# Patient Record
Sex: Male | Born: 1955 | Race: White | Hispanic: No | Smoking: Former smoker
Health system: Southern US, Community
[De-identification: ages and names within clinical notes are randomized; demographics above are authoritative.]

## PROBLEM LIST (undated history)

## (undated) ENCOUNTER — Emergency Department: Discharge status: Absent without leave | Payer: Self-pay

## (undated) DIAGNOSIS — F32A Depression, unspecified: Secondary | ICD-10-CM

## (undated) DIAGNOSIS — E7800 Pure hypercholesterolemia, unspecified: Secondary | ICD-10-CM

## (undated) DIAGNOSIS — M1990 Unspecified osteoarthritis, unspecified site: Secondary | ICD-10-CM

## (undated) DIAGNOSIS — M549 Dorsalgia, unspecified: Secondary | ICD-10-CM

## (undated) DIAGNOSIS — I2510 Atherosclerotic heart disease of native coronary artery without angina pectoris: Secondary | ICD-10-CM

## (undated) DIAGNOSIS — I10 Essential (primary) hypertension: Secondary | ICD-10-CM

## (undated) DIAGNOSIS — F329 Major depressive disorder, single episode, unspecified: Secondary | ICD-10-CM

## (undated) DIAGNOSIS — F419 Anxiety disorder, unspecified: Secondary | ICD-10-CM

## (undated) HISTORY — DX: Depression, unspecified: F32.A

## (undated) HISTORY — DX: Pure hypercholesterolemia, unspecified: E78.00

## (undated) HISTORY — DX: Major depressive disorder, single episode, unspecified: F32.9

## (undated) HISTORY — DX: Essential (primary) hypertension: I10

## (undated) HISTORY — DX: Atherosclerotic heart disease of native coronary artery without angina pectoris: I25.10

## (undated) HISTORY — DX: Anxiety disorder, unspecified: F41.9

## (undated) HISTORY — PX: OTHER SURGICAL HISTORY: SHX169

## (undated) HISTORY — PX: HERNIA REPAIR: SHX51

## (undated) HISTORY — DX: Dorsalgia, unspecified: M54.9

## (undated) HISTORY — PX: VASECTOMY: SHX75

## (undated) HISTORY — DX: Unspecified osteoarthritis, unspecified site: M19.90

---

## 2003-04-04 ENCOUNTER — Ambulatory Visit (HOSPITAL_COMMUNITY): Admission: RE | Admit: 2003-04-04 | Discharge: 2003-04-04 | Payer: Self-pay | Admitting: General Surgery

## 2003-04-04 ENCOUNTER — Encounter: Payer: Self-pay | Admitting: General Surgery

## 2005-03-25 ENCOUNTER — Encounter: Admission: RE | Admit: 2005-03-25 | Discharge: 2005-03-25 | Payer: Self-pay | Admitting: *Deleted

## 2005-03-26 ENCOUNTER — Ambulatory Visit (HOSPITAL_COMMUNITY): Admission: RE | Admit: 2005-03-26 | Discharge: 2005-03-26 | Payer: Self-pay | Admitting: *Deleted

## 2005-12-03 ENCOUNTER — Encounter: Admission: RE | Admit: 2005-12-03 | Discharge: 2005-12-03 | Payer: Self-pay | Admitting: Family Medicine

## 2005-12-08 ENCOUNTER — Encounter: Admission: RE | Admit: 2005-12-08 | Discharge: 2005-12-08 | Payer: Self-pay | Admitting: Family Medicine

## 2007-10-12 ENCOUNTER — Encounter: Admission: RE | Admit: 2007-10-12 | Discharge: 2007-10-12 | Payer: Self-pay | Admitting: Family Medicine

## 2008-10-16 ENCOUNTER — Encounter: Admission: RE | Admit: 2008-10-16 | Discharge: 2008-10-16 | Payer: Self-pay | Admitting: Family Medicine

## 2010-11-01 ENCOUNTER — Encounter: Payer: Self-pay | Admitting: Sports Medicine

## 2010-11-02 ENCOUNTER — Encounter: Payer: Self-pay | Admitting: Family Medicine

## 2011-02-27 NOTE — Op Note (Signed)
NAME:  Jamie Rice, Jamie Rice                 ACCOUNT NO.:  [id number]   MEDICAL RECORD NO.:  [id number]                   PATIENT TYPE:  AMB   LOCATION:  DAY                                  FACILITY:  [facility]   PHYSICIAN:  Timothy E. Earlene Plater, M.D.              DATE OF BIRTH:  05-12-1956   DATE OF PROCEDURE:  04/04/2003  DATE OF DISCHARGE:                                 OPERATIVE REPORT   PREOPERATIVE DIAGNOSIS:  Ventral and umbilical hernia.   POSTOPERATIVE DIAGNOSIS:  Ventral and umbilicus hernia.   OPERATION/PROCEDURE:  Repair of ventral incisional hernia and umbilical  hernia, both with mesh.   SURGEON:  Timothy E. Earlene Plater, M.D.   ANESTHESIA:  General.   INDICATIONS:  Jamie Rice is an otherwise healthy 55 year old Caucasian  male who has developed over the past few months a painful, enlarging ventral  incisional hernia at the lower portion of a prior midline incision and an  umbilical hernia.  He was seen in the office yesterday in exquisite pain.  Both hernias were reducible.  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  Otherwise  his past history is unremarkable.  He did have an open hiatal hernia repair  approximately 25 years ago.  He has been fully informed and carefully  counseled in regards to the repair and this is being done as an urgent at  his request.  He is out of work at the present time because of the hernia  pain.  He was identified and the permit signed.  Laboratory data and EKG  reviewed by anesthesia.   DESCRIPTION OF PROCEDURE:  He was taken to the operating room and placed  supine.  General endotracheal anesthesia was administered.  The abdomen was  clear of hair.  It was prepped and draped in the usual fashion.  The ventral  incisional hernia was palpated and the lower part of the midline incision  which was  approximately 8 cm from the superior rim of the umbilicus.  The  umbilical hernia was in the umbilical circle just to the left of the  midline.  The skin and subcutaneous tissue were anesthetized with 0.5%  Marcaine with epinephrine.  An incision was made from the ventral hernia to  the superior rim of the umbilicus skin.  Subcutaneous dissection  accomplished, the hernia identified.  The surrounding fascia carefully  cleared away.  There was a second tiny hernia just above the known and more  obvious hernia.  This, too, was dissected free and the fascia around it was  identified and skeletonized.  The hernia contents were preperitoneal fat  only.  A couple of globs of that fat were removed with cautery but most of  it was just simply reduced.  Blunt dissection beneath the fascia freed the  fascial  edges.  The interrupted sutures of 0 Prolene were placed in the  defect and the defect closed with knots buried.  Then a piece of mesh was  cut and fashioned to fit well around the actual hernia defect and this was  sewn with continuous 2-0 PDS with the knots buried.   Attention was then turned to the umbilicus where careful sharp dissection  revealed otherwise normal fascia.  The hernia encompassed the umbilical  sites and was only about 1-1.5 cm in diameter.  The umbilical skin was  released from the preperitoneal fat and the preperitoneal fat was reduced  through the umbilical defect.  Likewise the underside was bluntly dissected  and that defect was closed with interrupted buried 0 Prolene sutures.  A  second piece of mesh was cut and fashioned to fit that defect and that, too,  was sewn down with running 2-0 PDS.  With this, the procedure was complete.  The wound was perfectly dry.  The subcutaneous was approximated in the deep  portion. The umbilical skin was indented inwardly.  The skin was closed with  3-0 Monocryl subcuticular.  Steri-Strips carefully applied.  Bandage  carefully  applied.  He tolerated it well.  Counts were correct.  He was  awakened and taken to the recovery room in good condition.   Written and verbal instructions given him and his father and he will be seen  and followed as an outpatient.  Percocet #48.                                               Timothy E. Earlene Plater, M.D.    TED/MEDQ  D:  04/04/2003  T:  04/04/2003  Job:  [id number]   cc:   L. Lupe Carney, M.D.  [address] [place]  [place] [address]  Fax: [phone]

## 2011-02-27 NOTE — Cardiovascular Report (Signed)
NAME:  Jamie Rice, GRUENEWALD NO.:  [id number]   MEDICAL RECORD NO.:  [id number]          PATIENT TYPE:  OIB   LOCATION:  2899                         FACILITY:  [facility]   PHYSICIAN:  Meade Maw, M.D.    DATE OF BIRTH:  1955-12-19   DATE OF PROCEDURE:  DATE OF DISCHARGE:                              CARDIAC CATHETERIZATION   REFERRING PHYSICIAN:  L. Lupe Carney, M.D.   INDICATIONS FOR PROCEDURE:  Reproducible chest pain on treadmill, ongoing  progressive chest pain.   PROCEDURE PERFORMED:  Left heart catheterization, coronary angiography,  single-plane ventriculogram.   DESCRIPTION OF PROCEDURE:  Obtaining written informed consent, the patient  was brought to the cardiac catheterization lab in the post absorptive state.  Preop sedation was achieved using Versed 10 mg IV, fentanyl 50 mcg IV and  Valium 10 mg p.o. The right groin was prepped and draped in the usual  sterile fashion, local anesthesia was achieved using 1% Xylocaine. A 6-  [place] hemostasis sheath was placed into the right femoral artery using  modified Seldinger technique. Selective coronary angiography was performed  using a JL-4, JR-4 Judkins catheter, multiple views were obtained and all  catheter exchanges were made over a guidewire. The hemostasis sheath was  flushed following each catheter exchange. There was no critical coronary  artery disease. The patient was transferred to the holding area. Hemostasis  was achieved using digital pressure.   FINDINGS:  Aortic pressure is 116/67, LV pressure is 111/12, EDP of 13.  Single-plane ventriculogram revealed normal wall motion with ejection  fraction of 65%.   CORONARY ANGIOGRAPHY:  The left main coronary artery bifurcates into the  left anterior descending and circumflex vessel. There is no disease noted in  the left main coronary artery.   LEFT ANTERIOR DESCENDING:  The left anterior descending gives rise to a  moderate D1, large D2, goes on to  end as an apical recurrent branch. There  is no disease noted in the left anterior descending or its branches.   CIRCUMFLEX VESSEL: The circumflex vessel is a large caliber vessel giving  rise to a trivial OM1, large bifurcating OM2  and this__________ vessel.  There is no disease noted in the circumflex or its branches.   RIGHT CORONARY ARTERY:  The right coronary artery is a large dominant  artery. There is luminal irregularities of up to 20-30% in the right  coronary artery.   FINAL IMPRESSION:  1.  Noncritical coronary artery disease.  2.  With luminal irregularities of up to 20-30%.  3.  Normal systolic function, ejection fraction of 65%.  4.  Fatigue may be in part related to bradycardia. The patient was noted to      have heart rates in the 40s during the procedure. His metoprolol was      discontinued and he was started on Norvasc 5 mg p.o. for his blood      pressure.       HP/MEDQ  D:  03/26/2005  T:  03/26/2005  Job:  [id number]

## 2011-08-20 ENCOUNTER — Other Ambulatory Visit: Payer: Self-pay | Admitting: Family Medicine

## 2011-08-20 ENCOUNTER — Ambulatory Visit
Admission: RE | Admit: 2011-08-20 | Discharge: 2011-08-20 | Disposition: A | Discharge status: Home / Self Care | Payer: BC Managed Care – PPO | Source: Ambulatory Visit | Attending: Family Medicine | Admitting: Family Medicine

## 2011-08-20 DIAGNOSIS — R52 Pain, unspecified: Secondary | ICD-10-CM

## 2013-08-10 ENCOUNTER — Telehealth (HOSPITAL_COMMUNITY): Payer: Self-pay | Admitting: *Deleted

## 2013-08-15 ENCOUNTER — Encounter: Payer: Self-pay | Admitting: *Deleted

## 2013-08-15 ENCOUNTER — Encounter: Payer: Self-pay | Admitting: Interventional Cardiology

## 2013-08-15 DIAGNOSIS — M1990 Unspecified osteoarthritis, unspecified site: Secondary | ICD-10-CM | POA: Insufficient documentation

## 2013-08-15 DIAGNOSIS — I2510 Atherosclerotic heart disease of native coronary artery without angina pectoris: Secondary | ICD-10-CM | POA: Insufficient documentation

## 2013-08-15 DIAGNOSIS — F419 Anxiety disorder, unspecified: Secondary | ICD-10-CM | POA: Insufficient documentation

## 2013-08-15 DIAGNOSIS — I10 Essential (primary) hypertension: Secondary | ICD-10-CM | POA: Insufficient documentation

## 2013-08-15 DIAGNOSIS — F329 Major depressive disorder, single episode, unspecified: Secondary | ICD-10-CM | POA: Insufficient documentation

## 2013-08-15 DIAGNOSIS — E7800 Pure hypercholesterolemia, unspecified: Secondary | ICD-10-CM | POA: Insufficient documentation

## 2013-08-15 DIAGNOSIS — M549 Dorsalgia, unspecified: Secondary | ICD-10-CM | POA: Insufficient documentation

## 2013-08-16 ENCOUNTER — Ambulatory Visit (INDEPENDENT_AMBULATORY_CARE_PROVIDER_SITE_OTHER): Payer: Medicaid Other | Admitting: Interventional Cardiology

## 2013-08-16 ENCOUNTER — Encounter: Payer: Self-pay | Admitting: Interventional Cardiology

## 2013-08-16 ENCOUNTER — Telehealth: Payer: Self-pay | Admitting: Interventional Cardiology

## 2013-08-16 VITALS — BP 142/86 | HR 55 | Ht 71.5 in | Wt 201.8 lb

## 2013-08-16 DIAGNOSIS — I2510 Atherosclerotic heart disease of native coronary artery without angina pectoris: Secondary | ICD-10-CM | POA: Insufficient documentation

## 2013-08-16 DIAGNOSIS — I10 Essential (primary) hypertension: Secondary | ICD-10-CM | POA: Insufficient documentation

## 2013-08-16 DIAGNOSIS — I2119 ST elevation (STEMI) myocardial infarction involving other coronary artery of inferior wall: Secondary | ICD-10-CM

## 2013-08-16 DIAGNOSIS — E1169 Type 2 diabetes mellitus with other specified complication: Secondary | ICD-10-CM | POA: Insufficient documentation

## 2013-08-16 DIAGNOSIS — I252 Old myocardial infarction: Secondary | ICD-10-CM | POA: Insufficient documentation

## 2013-08-16 DIAGNOSIS — E782 Mixed hyperlipidemia: Secondary | ICD-10-CM | POA: Insufficient documentation

## 2013-08-16 DIAGNOSIS — R5381 Other malaise: Secondary | ICD-10-CM

## 2013-08-16 NOTE — Progress Notes (Signed)
Patient ID: Jamie Rice, male   DOB: December 12, 1955, 57 y.o.   MRN: [id number]     Patient ID: Jamie Rice MRN: [id number] DOB/AGE: Oct 28, 1955 57 y.o.   Referring Physician  Dr. Lupe Carney   Reason for Consultation recent Inferior MI  HPI: 57 y/o who had an inferior MI in October 2014.  He had a bare metal stent placed and did well.  He had no chest pain.  He felt hot and sweaty.  No SHOB.  He was taken to [facility] and had an emergency cath.  Mild CP with SHOB since the heart atack.  He has been tired.  He sleeps a lot.  He feels tired even after a nap.  No LE swelling.  No orthopnea.    Records from [facility] reviewed. He had a 4.0 x 20 bare-metal stent to the right coronary artery. He had inferior and apical hypokinesis. Ejection fraction was preserved.      Current Outpatient Prescriptions  Medication Sig Dispense Refill  . aspirin 81 MG tablet Take 81 mg by mouth daily.      Marland Kitchen atorvastatin (LIPITOR) 80 MG tablet Take 80 mg by mouth daily.      . carvedilol (COREG) 6.25 MG tablet Take 6.25 mg by mouth 2 (two) times daily with a meal.      . Cholecalciferol (VITAMIN D) 400 UNIT/ML LIQD Take by mouth. 400 unit Tablet      . clopidogrel (PLAVIX) 75 MG tablet Take 75 mg by mouth daily with breakfast.      . enalapril (VASOTEC) 10 MG tablet Take 10 mg by mouth daily.      . ferrous sulfate 325 (65 FE) MG tablet Take 325 mg by mouth daily with breakfast.      . nitroGLYCERIN (NITROSTAT) 0.4 MG SL tablet Place 0.4 mg under the tongue every 5 (five) minutes as needed for chest pain.      . Omega-3 Fatty Acids (FISH OIL PO) Take 1 tablet by mouth daily.      . Ticagrelor (BRILINTA) 90 MG TABS tablet Take 90 mg by mouth 2 (two) times daily.      Marland Kitchen triamcinolone (NASACORT AQ) 55 MCG/ACT AERO nasal inhaler Place 2 sprays into the nose daily.       No current facility-administered medications for this visit.   Past Medical History  Diagnosis Date  . Hypercholesteremia   .  HTN (hypertension)   . Depression   . Anxiety   . Arthritis   . Back pain   . CAD (coronary artery disease)     Family History  Problem Relation Age of Onset  . Heart disease Father     History   Social History  . Marital Status: Widowed    Spouse Name: N/A    Number of Children: N/A  . Years of Education: N/A   Occupational History  . Not on file.   Social History Main Topics  . Smoking status: Former Games developer  . Smokeless tobacco: Not on file  . Alcohol Use: Yes  . Drug Use: No  . Sexual Activity: Not on file   Other Topics Concern  . Not on file   Social History Narrative  . No narrative on file    Past Surgical History  Procedure Laterality Date  . Hernia repair    . Vasectomy    . Ptca with stent - [facility]        (Not in a hospital admission)  Review of  systems complete and found to be negative unless listed above .  No nausea, vomiting.  No fever chills, No focal weakness,  No palpitations.  Physical Exam: Filed Vitals:   08/16/13 1103  BP: 142/86  Pulse: 55    Weight: 201 lb 12.8 oz (91.536 kg)  Physical exam:  [place]/AT EOMI No JVD, No carotid bruit RRR S1S2  No wheezing Soft. NT, nondistended No edema. 2+ posterior tibial pulses bilaterally No focal motor or sensory deficits Normal affect  Labs:   No results found for this basename: WBC, HGB, HCT, MCV, PLT   No results found for this basename: NA, K, CL, CO2, BUN, CREATININE, CALCIUM, LABALBU, PROT, BILITOT, ALKPHOS, ALT, AST, GLUCOSE,  in the last 168 hours No results found for this basename: CKTOTAL, CKMB, CKMBINDEX, TROPONINI    No results found for this basename: CHOL   No results found for this basename: HDL   No results found for this basename: LDLCALC   No results found for this basename: TRIG   No results found for this basename: CHOLHDL   No results found for this basename: LDLDIRECT       EKG: Initial ECG from MI reveals sinus rhythm with marked inferior ST  elevation  ASSESSMENT AND PLAN: Acute inferior MI, status post bare-metal stent, hyperlipidemia, hypertension  1. continue dual antiplatelet therapy with Brilinta for a month. Bare-metal stent so he does not require mandatory 1 year dual antiplatelet therapy. He will switch to Plavix for cost savings after a month.  No bleeding issues. 2.  fatigue is increased. I encouraged him to start cardiac rehabilitation. Will check echocardiogram to evaluate LV function and see if there is any valvular abnormality post MI which explains his decreased energy.  No obvious signs of heart failure. 3. CAD : Mild to moderate disease in the left system. Should not be causing angina. 4. hyperlipidemia: Continue atorvastatin.  He'll need lipids checked as well. LDL target 70. 5. hypertension: Continue enalapril. He is also on carvedilol. Some of the medications may be contributing to fatigue. Will see how he is feeling after he begins cardiac rehabilitation. Hopefully this will improve.  Signed:   Fredric Mare, MD, [facility] 08/16/2013, 11:11 AM

## 2013-08-16 NOTE — Telephone Encounter (Signed)
ROI faxed to [facility]/ MR Dept at [phone] call back [phone]

## 2013-08-16 NOTE — Patient Instructions (Signed)
Your physician has requested that you have an echocardiogram. Echocardiography is a painless test that uses sound waves to create images of your heart. It provides your doctor with information about the size and shape of your heart and how well your heart's chambers and valves are working. This procedure takes approximately one hour. There are no restrictions for this procedure.  Your physician recommends that you schedule a follow-up appointment in 3 months with Dr. Eldridge Dace.

## 2013-08-24 ENCOUNTER — Encounter (HOSPITAL_COMMUNITY)
Admission: RE | Admit: 2013-08-24 | Discharge: 2013-08-24 | Disposition: A | Discharge status: Home / Self Care | Payer: Medicaid Other | Source: Ambulatory Visit | Attending: Interventional Cardiology | Admitting: Interventional Cardiology

## 2013-08-24 NOTE — Progress Notes (Signed)
Cardiac Rehab Medication Review by a Pharmacist  Does the patient  feel that his/her medications are working for him/her?  yes  Has the patient been experiencing any side effects to the medications prescribed?  no  Does the patient measure his/her own blood pressure or blood glucose at home?  yes   Does the patient have any problems obtaining medications due to transportation or finances?   no  Understanding of regimen: good Understanding of indications: good Potential of compliance: excellent    Pharmacist comments:  19 YOM who presents in good spirits by appears lethargic. Patient denies any known allergies besides for his seasonal allergies, currently mitigating with nasacort. I have updated his medication list. Patient reports BP around 128/73 and denies any adverse effects from his medications but does seem to feel much more tired since his heart attack in October. He reports fatigue after exertion much quicker than in the past. I assured patient that it would take a little time for him to recover and that he should be hopeful as he will likely see his energy increase over the next few weeks.   Thanks for allowing me to take part in the care of this patient,  Britt Bottom B. Artelia Laroche, PharmD Clinical Pharmacist - Resident Pager: [phone] Phone: [phone] 08/24/2013 8:23 AM

## 2013-08-28 ENCOUNTER — Telehealth (HOSPITAL_COMMUNITY): Payer: Self-pay | Admitting: Cardiac Rehabilitation

## 2013-08-28 ENCOUNTER — Encounter (HOSPITAL_COMMUNITY): Admission: RE | Admit: 2013-08-28 | Payer: Medicaid Other | Source: Ambulatory Visit

## 2013-08-28 NOTE — Telephone Encounter (Signed)
pc received from pt will be absent from cardiac rehab today due to bad head cold.

## 2013-08-29 ENCOUNTER — Telehealth: Payer: Self-pay | Admitting: Cardiology

## 2013-08-29 ENCOUNTER — Ambulatory Visit (HOSPITAL_COMMUNITY): Payer: Medicaid Other | Attending: Cardiology | Admitting: Cardiology

## 2013-08-29 ENCOUNTER — Encounter: Payer: Self-pay | Admitting: Cardiology

## 2013-08-29 DIAGNOSIS — I2119 ST elevation (STEMI) myocardial infarction involving other coronary artery of inferior wall: Secondary | ICD-10-CM | POA: Insufficient documentation

## 2013-08-29 DIAGNOSIS — I10 Essential (primary) hypertension: Secondary | ICD-10-CM | POA: Insufficient documentation

## 2013-08-29 DIAGNOSIS — Z87891 Personal history of nicotine dependence: Secondary | ICD-10-CM | POA: Insufficient documentation

## 2013-08-29 DIAGNOSIS — E785 Hyperlipidemia, unspecified: Secondary | ICD-10-CM | POA: Insufficient documentation

## 2013-08-29 DIAGNOSIS — I2510 Atherosclerotic heart disease of native coronary artery without angina pectoris: Secondary | ICD-10-CM | POA: Insufficient documentation

## 2013-08-29 NOTE — Progress Notes (Signed)
Echo performed. 

## 2013-08-29 NOTE — Telephone Encounter (Signed)
Pt requested to speak with me before he had echocardiogram this morning. Pt states he has had episodes where he has woken up having to cough and feeling sweaty and hot. Pt states this has happened at least three times over the last week. Pt also states he has Cardiac Rehab on Mon, Wed, and Fri from 8:15am to 9:45am. Pt is requesting disability until he gets CR completed. If not, he needs a note stating that he has rehab three times a week.

## 2013-08-30 ENCOUNTER — Encounter (HOSPITAL_COMMUNITY): Payer: Medicaid Other

## 2013-08-30 NOTE — Telephone Encounter (Signed)
I don't think he would qualify for disability. If he has symptoms similar to his prior MI, we would have to consider repeat cardiac catheterization. OK to give him a note stating that he has cardiac rehabilitation 3 times a week as he has requested.

## 2013-08-31 ENCOUNTER — Encounter: Payer: Self-pay | Admitting: Cardiology

## 2013-08-31 NOTE — Telephone Encounter (Signed)
Pt notified. Pt just had those three episodes where he became hot and clamy,which he did have prior to his MI. Pt denies CP. The last episode was Tuesday morning and he almost called 911. Pt has been feeling okay since then. I will mail a letter to pt stating he has cardiac rehab three times a week.

## 2013-09-01 ENCOUNTER — Encounter (HOSPITAL_COMMUNITY): Payer: Medicaid Other

## 2013-09-04 ENCOUNTER — Encounter (HOSPITAL_COMMUNITY): Payer: Medicaid Other

## 2013-09-06 ENCOUNTER — Encounter (HOSPITAL_COMMUNITY): Payer: Medicaid Other

## 2013-09-08 ENCOUNTER — Encounter (HOSPITAL_COMMUNITY): Payer: Medicaid Other

## 2013-09-11 ENCOUNTER — Encounter (HOSPITAL_COMMUNITY): Payer: Medicaid Other

## 2013-09-12 ENCOUNTER — Telehealth (HOSPITAL_COMMUNITY): Payer: Self-pay | Admitting: Cardiac Rehabilitation

## 2013-09-12 NOTE — Telephone Encounter (Signed)
pc to pt to assess reason for continued absence from cardiac rehab. Left message on voice mail.  

## 2013-09-13 ENCOUNTER — Encounter (HOSPITAL_COMMUNITY): Payer: Medicaid Other

## 2013-09-15 ENCOUNTER — Encounter (HOSPITAL_COMMUNITY): Payer: Medicaid Other

## 2013-09-18 ENCOUNTER — Encounter (HOSPITAL_COMMUNITY): Payer: Medicaid Other

## 2013-09-19 ENCOUNTER — Telehealth (HOSPITAL_COMMUNITY): Payer: Self-pay | Admitting: Cardiac Rehabilitation

## 2013-09-19 NOTE — Telephone Encounter (Signed)
pc to pt to assess reason for continued absence from cardiac rehab.  Left message on vm.

## 2013-09-20 ENCOUNTER — Encounter (HOSPITAL_COMMUNITY): Payer: Medicaid Other

## 2013-09-22 ENCOUNTER — Encounter (HOSPITAL_COMMUNITY): Payer: Medicaid Other

## 2013-09-25 ENCOUNTER — Encounter (HOSPITAL_COMMUNITY): Payer: Medicaid Other

## 2013-09-27 ENCOUNTER — Encounter (HOSPITAL_COMMUNITY): Payer: Medicaid Other

## 2013-09-29 ENCOUNTER — Encounter (HOSPITAL_COMMUNITY): Payer: Medicaid Other

## 2013-10-02 ENCOUNTER — Encounter (HOSPITAL_COMMUNITY): Payer: Medicaid Other

## 2013-10-02 ENCOUNTER — Telehealth: Payer: Self-pay | Admitting: Interventional Cardiology

## 2013-10-02 NOTE — Telephone Encounter (Signed)
New problem     Pt would like a call back on what he can take for his sinus drainage  with the med he is taking? Pt says draining into his pocket.   Please give pt a call back.

## 2013-10-02 NOTE — Telephone Encounter (Signed)
To Jeremy, please advise.  

## 2013-10-02 NOTE — Telephone Encounter (Signed)
Patient states his drainage at night is really bad and wants to know if there is a better antihistamine than Claritin.  He doesn't want something that will make him sedated during the day though.  Patient advised to take Zyrtec 10 mg qhs as this is typically more effective than claritin.  If this doesn't work, could consider chlorphenarimine based antihistamine, but this is often too sedating.

## 2013-10-04 ENCOUNTER — Encounter (HOSPITAL_COMMUNITY): Payer: Medicaid Other

## 2013-10-06 ENCOUNTER — Encounter (HOSPITAL_COMMUNITY): Payer: Medicaid Other

## 2013-10-09 ENCOUNTER — Encounter (HOSPITAL_COMMUNITY): Payer: Medicaid Other

## 2013-10-11 ENCOUNTER — Encounter (HOSPITAL_COMMUNITY): Payer: Medicaid Other

## 2013-10-13 ENCOUNTER — Encounter (HOSPITAL_COMMUNITY): Payer: Medicaid Other

## 2013-10-16 ENCOUNTER — Encounter (HOSPITAL_COMMUNITY): Payer: Medicaid Other

## 2013-10-18 ENCOUNTER — Encounter (HOSPITAL_COMMUNITY): Payer: Medicaid Other

## 2013-10-20 ENCOUNTER — Encounter (HOSPITAL_COMMUNITY): Payer: Medicaid Other

## 2013-10-23 ENCOUNTER — Encounter (HOSPITAL_COMMUNITY): Payer: Medicaid Other

## 2013-10-25 ENCOUNTER — Encounter (HOSPITAL_COMMUNITY): Payer: Medicaid Other

## 2013-10-27 ENCOUNTER — Encounter (HOSPITAL_COMMUNITY): Payer: Medicaid Other

## 2013-10-30 ENCOUNTER — Encounter (HOSPITAL_COMMUNITY): Payer: Medicaid Other

## 2013-11-01 ENCOUNTER — Encounter (HOSPITAL_COMMUNITY): Payer: Medicaid Other

## 2013-11-03 ENCOUNTER — Encounter (HOSPITAL_COMMUNITY): Payer: Medicaid Other

## 2013-11-06 ENCOUNTER — Encounter (HOSPITAL_COMMUNITY): Payer: Medicaid Other

## 2013-11-08 ENCOUNTER — Encounter (HOSPITAL_COMMUNITY): Payer: Medicaid Other

## 2013-11-10 ENCOUNTER — Encounter (HOSPITAL_COMMUNITY): Payer: Medicaid Other

## 2013-11-13 ENCOUNTER — Encounter (HOSPITAL_COMMUNITY): Payer: Medicaid Other

## 2013-11-15 ENCOUNTER — Encounter (HOSPITAL_COMMUNITY): Payer: Medicaid Other

## 2013-11-15 ENCOUNTER — Ambulatory Visit: Payer: Medicaid Other | Admitting: Interventional Cardiology

## 2013-11-17 ENCOUNTER — Encounter (HOSPITAL_COMMUNITY): Payer: Medicaid Other

## 2013-11-20 ENCOUNTER — Encounter (HOSPITAL_COMMUNITY): Payer: Medicaid Other

## 2013-11-22 ENCOUNTER — Encounter (HOSPITAL_COMMUNITY): Payer: Medicaid Other

## 2013-11-24 ENCOUNTER — Encounter (HOSPITAL_COMMUNITY): Payer: Medicaid Other

## 2013-11-27 ENCOUNTER — Encounter (HOSPITAL_COMMUNITY): Payer: Medicaid Other

## 2013-11-29 ENCOUNTER — Encounter (HOSPITAL_COMMUNITY): Payer: Medicaid Other

## 2013-12-01 ENCOUNTER — Encounter (HOSPITAL_COMMUNITY): Payer: Medicaid Other

## 2013-12-20 ENCOUNTER — Ambulatory Visit: Payer: Medicaid Other | Admitting: Physician Assistant

## 2013-12-30 ENCOUNTER — Encounter: Payer: Self-pay | Admitting: *Deleted

## 2014-01-04 ENCOUNTER — Ambulatory Visit: Payer: Medicaid Other | Admitting: Physician Assistant

## 2014-07-04 ENCOUNTER — Ambulatory Visit (INDEPENDENT_AMBULATORY_CARE_PROVIDER_SITE_OTHER): Payer: Medicaid Other | Admitting: Interventional Cardiology

## 2014-07-04 ENCOUNTER — Encounter: Payer: Self-pay | Admitting: Interventional Cardiology

## 2014-07-04 VITALS — BP 138/82 | HR 55 | Ht 71.25 in | Wt 207.4 lb

## 2014-07-04 DIAGNOSIS — E782 Mixed hyperlipidemia: Secondary | ICD-10-CM

## 2014-07-04 DIAGNOSIS — Z01810 Encounter for preprocedural cardiovascular examination: Secondary | ICD-10-CM

## 2014-07-04 DIAGNOSIS — I2119 ST elevation (STEMI) myocardial infarction involving other coronary artery of inferior wall: Secondary | ICD-10-CM

## 2014-07-04 DIAGNOSIS — I252 Old myocardial infarction: Secondary | ICD-10-CM

## 2014-07-04 DIAGNOSIS — I2510 Atherosclerotic heart disease of native coronary artery without angina pectoris: Secondary | ICD-10-CM

## 2014-07-04 DIAGNOSIS — I10 Essential (primary) hypertension: Secondary | ICD-10-CM

## 2014-07-04 LAB — COMPREHENSIVE METABOLIC PANEL
ALBUMIN: 4.2 g/dL (ref 3.5–5.2)
ALK PHOS: 74 U/L (ref 39–117)
ALT: 19 U/L (ref 0–53)
AST: 16 U/L (ref 0–37)
BUN: 13 mg/dL (ref 6–23)
CO2: 32 mEq/L (ref 19–32)
Calcium: 9 mg/dL (ref 8.4–10.5)
Chloride: 102 mEq/L (ref 96–112)
Creatinine, Ser: 1.1 mg/dL (ref 0.4–1.5)
GFR: 74.55 mL/min (ref >60.00)
Glucose, Bld: 127 mg/dL — ABNORMAL HIGH (ref 70–99)
POTASSIUM: 4.2 meq/L (ref 3.5–5.1)
SODIUM: 137 meq/L (ref 135–145)
TOTAL PROTEIN: 7.1 g/dL (ref 6.0–8.3)
Total Bilirubin: 0.3 mg/dL (ref 0.2–1.2)

## 2014-07-04 LAB — LIPID PANEL
CHOL/HDL RATIO: 6
Cholesterol: 220 mg/dL — ABNORMAL HIGH (ref 0–200)
HDL: 34.9 mg/dL — ABNORMAL LOW (ref >39.00)
NONHDL: 185.1
Triglycerides: 399 mg/dL — ABNORMAL HIGH (ref 0.0–149.0)
VLDL: 79.8 mg/dL — ABNORMAL HIGH (ref 0.0–40.0)

## 2014-07-04 LAB — LDL CHOLESTEROL, DIRECT: Direct LDL: 123.6 mg/dL

## 2014-07-04 MED ORDER — ATORVASTATIN CALCIUM 80 MG PO TABS: 80.0000 mg | ORAL_TABLET | Freq: Every day | ORAL | Status: DC

## 2014-07-04 MED ORDER — ENALAPRIL MALEATE 10 MG PO TABS: 10.0000 mg | ORAL_TABLET | Freq: Every day | ORAL | Status: DC

## 2014-07-04 MED ORDER — CLOPIDOGREL BISULFATE 75 MG PO TABS: 75.0000 mg | ORAL_TABLET | Freq: Every day | ORAL | Status: DC

## 2014-07-04 NOTE — Progress Notes (Signed)
Patient ID: Jamie Rice, male   DOB: Jun 13, 1956, 58 y.o.   MRN: [id number] Patient ID: Jamie Rice, male   DOB: May 10, 1956, 58 y.o.   MRN: [id number]     Patient ID: Jamie Rice MRN: [id number] DOB/AGE: [birth year] 58 y.o.   Referring Physician  Dr. Donnie Coffin   Reason for Consultation recent Inferior MI  HPI: 58 y/o who had an inferior MI in October 2014.  He had a bare metal stent placed and did well.  He had no chest pain.  He felt hot and sweaty.  No SHOB.  He was taken to [facility] and had an emergency cath.  Mild CP with SHOB since the heart atack.  He has been tired.  He sleeps a lot, but oes not sleep well at night.  He feels tired even after a nap.  No LE swelling.  No orthopnea.    Records from [place] reviewed. He had a 4.0 x 20 bare-metal stent to the right coronary artery in October 2014. He had inferior and apical hypokinesis. Ejection fraction was preserved.   He is anticipating hip replacement in 11/15.  He walks up and down the street and this is limited by hip pain.  No chest pain or any sx like the heart attack.  He can go 15-20 minutes and has no cardiac sx.   He was out of insurance so was out of all of his prescription meds since Spring 2015.     Current Outpatient Prescriptions  Medication Sig Dispense Refill  . aspirin 81 MG tablet Take 81 mg by mouth daily.      . Cinnamon 500 MG capsule Take 500 mg by mouth 2 (two) times daily.      . Omega-3 Fatty Acids (FISH OIL PO) Take 1 tablet by mouth daily.       No current facility-administered medications for this visit.   Past Medical History  Diagnosis Date  . Hypercholesteremia   . HTN (hypertension)   . Depression   . Anxiety   . Arthritis   . Back pain   . CAD (coronary artery disease)     Family History  Problem Relation Age of Onset  . Heart disease Father     History   Social History  . Marital Status: Widowed    Spouse Name: N/A    Number of Children: N/A  . Years  of Education: N/A   Occupational History  . Not on file.   Social History Main Topics  . Smoking status: Former Research scientist (life sciences)  . Smokeless tobacco: Not on file  . Alcohol Use: Yes  . Drug Use: No  . Sexual Activity: Not on file   Other Topics Concern  . Not on file   Social History Narrative  . No narrative on file    Past Surgical History  Procedure Laterality Date  . Hernia repair    . Vasectomy    . Ptca with stent - [facility]        (Not in a hospital admission)  Review of systems complete and found to be negative unless listed above .  No nausea, vomiting.  No fever chills, No focal weakness,  No palpitations.  Physical Exam: Filed Vitals:   07/04/14 0755  BP: 138/82  Pulse: 55    Weight: 207 lb 6.4 oz (94.076 kg)  Physical exam:  [place]/AT EOMI No JVD, No carotid bruit RRR S1S2  No wheezing Soft. NT, nondistended No edema. 2+ posterior tibial pulses  bilaterally No focal motor or sensory deficits Normal affect  Labs:   No results found for this basename: WBC,  HGB,  HCT,  MCV,  PLT   No results found for this basename: NA, K, CL, CO2, BUN, CREATININE, CALCIUM, LABALBU, PROT, BILITOT, ALKPHOS, ALT, AST, GLUCOSE,  in the last 168 hours No results found for this basename: CKTOTAL,  CKMB,  CKMBINDEX,  TROPONINI    No results found for this basename: CHOL   No results found for this basename: HDL   No results found for this basename: LDLCALC   No results found for this basename: TRIG   No results found for this basename: CHOLHDL   No results found for this basename: LDLDIRECT       EKG: Initial ECG from MI reveals sinus rhythm with marked inferior ST elevation; today Sinus bradycardia, no ST segment changes  ASSESSMENT AND PLAN: Acute inferior MI, status post bare-metal stent, hyperlipidemia, hypertension  1. Old MI: Finished dual antiplatelet therapy with Brilinta for a month. Bare-metal stent to RCA so he did not require 1 year dual antiplatelet therapy.  Will restart Plavix .  No bleeding issues. Wil have to hold PLavix or surgery. 2.  fatigue is increased. Finished start cardiac rehabilitation. Echocardiogram showed normal LV function, no significant valvular abnormality post MI which explains his decreased energy.  No obvious signs of heart failure.  Fatigue may be from deconditioning due to lack of activity from ortho issues. 3. CAD : Mild to moderate disease in the left system. Should not be causing angina. 4. hyperlipidemia: Restart atorvastatin.  He'll need lipids checked as well. LDL target 70.  Will adjust dose of atorvastatin based on lipids. 5. hypertension: Restart enalapril 10 mg daily. Hold carvedilol since resting HR 55. He finished cardiac rehabilitation. Hopefully this will improve with exercise as well. 6. No further cardiac testing needed before hip surgery.  He will have some labs preop most likely before hip surgery as well.   Signed:   Mina Marble, MD, [facility] 07/04/2014, 8:03 AM

## 2014-07-04 NOTE — Patient Instructions (Signed)
Your physician has recommended you make the following change in your medication:   1. Restart Plavix 75 mg 1 tablet daily.   2. Restart Enalapril 10 mg 1 tablet daily.   3. Restart Atorvastatin 80 mg 1 tablet daily.   Your physician recommends that you return for a FASTING lipid and cmet today.   Your physician wants you to follow-up in: 6 months with Dr. Irish Lack. You will receive a reminder letter in the mail two months in advance. If you don't receive a letter, please call our office to schedule the follow-up appointment.

## 2014-07-11 ENCOUNTER — Telehealth: Payer: Self-pay | Admitting: Interventional Cardiology

## 2014-07-11 ENCOUNTER — Other Ambulatory Visit: Payer: Self-pay | Admitting: Cardiology

## 2014-07-11 DIAGNOSIS — E782 Mixed hyperlipidemia: Secondary | ICD-10-CM

## 2014-07-11 NOTE — Telephone Encounter (Signed)
Returned pts call. See lipid note.

## 2014-07-11 NOTE — Telephone Encounter (Signed)
Follow up:    Per pt calling back for labs results.

## 2014-10-03 ENCOUNTER — Other Ambulatory Visit (INDEPENDENT_AMBULATORY_CARE_PROVIDER_SITE_OTHER): Payer: Medicaid Other | Admitting: *Deleted

## 2014-10-03 DIAGNOSIS — E782 Mixed hyperlipidemia: Secondary | ICD-10-CM

## 2014-10-03 LAB — LIPID PANEL
CHOL/HDL RATIO: 4
CHOLESTEROL: 146 mg/dL (ref 0–200)
HDL: 32.9 mg/dL — AB (ref >39.00)
LDL Cholesterol: 80 mg/dL (ref 0–99)
NonHDL: 113.1
Triglycerides: 167 mg/dL — ABNORMAL HIGH (ref 0.0–149.0)
VLDL: 33.4 mg/dL (ref 0.0–40.0)

## 2014-10-03 LAB — HEPATIC FUNCTION PANEL
ALBUMIN: 4.4 g/dL (ref 3.5–5.2)
ALT: 20 U/L (ref 0–53)
AST: 15 U/L (ref 0–37)
Alkaline Phosphatase: 127 U/L — ABNORMAL HIGH (ref 39–117)
BILIRUBIN TOTAL: 0.6 mg/dL (ref 0.2–1.2)
Bilirubin, Direct: 0 mg/dL (ref 0.0–0.3)
Total Protein: 7 g/dL (ref 6.0–8.3)

## 2014-11-27 ENCOUNTER — Telehealth: Payer: Self-pay | Admitting: Interventional Cardiology

## 2014-11-27 NOTE — Telephone Encounter (Signed)
New message     Pt is on plavix.  His PCP want to prescribe an antiflammatory for his arthritis.  Pt has been on plavix for over 1 yr.  Will it be ok to stop plavix and start the antiinflammatory?  If yes , call pt and also fax a note to Dr Donnie Coffin at [place]

## 2014-11-27 NOTE — Telephone Encounter (Signed)
Longterm antiinflammatory medicine is not good for his heart.  If it is for a short time, up to a week, that is ok.  What is the expected duration of therapy?

## 2014-11-27 NOTE — Telephone Encounter (Signed)
Left pt a message to call back. 

## 2014-11-27 NOTE — Telephone Encounter (Signed)
Pt return a call and states that his PCP Dr. Donnie Coffin at [facility]  Would like to prescribed an antiinflammatory medication for his arthritis. Pt needs to have D/C  Plavix before this medicationcan be  prescribed. Pt had bare metal stents placement in october 2014 . If pt can stop taking the Plavix. Pt  would like for this office to call him to let him know, and send a note to his PCP Dr.Dean Alroy Dust at [place] at [place].

## 2014-11-28 NOTE — Telephone Encounter (Signed)
I spoke with LouAnn from Dr. Virgilio Belling office. She was unsure of which NSAID was to be used. Dr. Alroy Dust was wanting to know if the patient was going to stay on plavix long term and that would help drive his decision. I advised her of Dr. Hassell Done recommendations thus far. I advised I would clarify how long the patient will need to be on plavix and is PRN usage of an NSAID ok. I will fax this in writing to [address] at [phone] when available.

## 2014-11-28 NOTE — Telephone Encounter (Signed)
I spoke with the patient and advised him of Dr. Hassell Done recommendations. He states that he was intending to take the NSAID PRN. I advised him I would call to Dr. Virgilio Belling office to find out what the recommended drug and directions for use are. I will then review with Dr. Irish Lack and call him back. He voices understanding. Alvis Lemmings, RN, BSN  I left a message with Dr. Virgilio Belling office to call back with recommended NSAID and directions for use [id number]). Alvis Lemmings, RN, BSN

## 2014-11-28 NOTE — Telephone Encounter (Signed)
I would plan to leave Plavix on longterm given his cardiovascular risk.  Prn anti inflammatory is ok as long as he is just taking it for a few days at a time- not daily.  WOuld consider some acid reducing therapy.  Could stop aspirin as long as he is on Plavix longterm to lower GI risk.

## 2014-11-29 ENCOUNTER — Encounter: Payer: Self-pay | Admitting: *Deleted

## 2014-11-29 NOTE — Telephone Encounter (Signed)
The patient is aware of Dr. Hassell Done recommendations to Dr. Alroy Dust. Letter being faxed to [facility] today at [phone]. Confirmation received.

## 2015-07-01 ENCOUNTER — Other Ambulatory Visit: Payer: Self-pay | Admitting: Interventional Cardiology

## 2015-08-02 ENCOUNTER — Other Ambulatory Visit: Payer: Self-pay | Admitting: Interventional Cardiology

## 2015-08-05 ENCOUNTER — Other Ambulatory Visit: Payer: Self-pay | Admitting: Interventional Cardiology

## 2015-08-21 ENCOUNTER — Other Ambulatory Visit: Payer: Self-pay | Admitting: Interventional Cardiology

## 2015-09-13 ENCOUNTER — Other Ambulatory Visit: Payer: Self-pay | Admitting: Interventional Cardiology

## 2015-09-24 ENCOUNTER — Other Ambulatory Visit: Payer: Self-pay | Admitting: Interventional Cardiology

## 2015-09-25 ENCOUNTER — Other Ambulatory Visit: Payer: Self-pay | Admitting: Interventional Cardiology

## 2015-10-01 ENCOUNTER — Encounter: Payer: Self-pay | Admitting: Interventional Cardiology

## 2015-10-09 ENCOUNTER — Other Ambulatory Visit: Payer: Self-pay | Admitting: Interventional Cardiology

## 2015-10-10 ENCOUNTER — Other Ambulatory Visit: Payer: Self-pay | Admitting: Interventional Cardiology

## 2015-10-28 ENCOUNTER — Other Ambulatory Visit: Payer: Self-pay | Admitting: *Deleted

## 2015-10-28 MED ORDER — ENALAPRIL MALEATE 10 MG PO TABS: 10.0000 mg | ORAL_TABLET | Freq: Every day | ORAL | Status: DC

## 2015-10-30 ENCOUNTER — Other Ambulatory Visit: Payer: Self-pay | Admitting: Interventional Cardiology

## 2015-11-07 ENCOUNTER — Other Ambulatory Visit: Payer: Self-pay | Admitting: Interventional Cardiology

## 2015-11-15 ENCOUNTER — Other Ambulatory Visit: Payer: Self-pay | Admitting: Interventional Cardiology

## 2015-11-22 ENCOUNTER — Other Ambulatory Visit: Payer: Self-pay | Admitting: Interventional Cardiology

## 2015-11-22 NOTE — Telephone Encounter (Signed)
I have left a detailed message on the pts VM stating that we cannot refill his medications until he calls and schedules an appt with Dr Irish Lack

## 2015-11-22 NOTE — Telephone Encounter (Signed)
Patient has not been seen since 2015. He has been given multiple refills with a note to call and schedule an appointment, but has still failed to do so. The last two refills have only been for two week supplies. Ok to deny request?

## 2015-11-22 NOTE — Telephone Encounter (Signed)
He should get these filled with his PMD if he has not followed up here.

## 2015-11-22 NOTE — Telephone Encounter (Signed)
The pt is also taking Plavix 75 mg daily according to 08/16/13 OV notes for: Bare-metal stent- he does not require mandatory 1 year dual antiplatelet therapy. He will switch to Plavix for cost savings after a month.  Is it ok to refer his refills to his PCP if he does not schedule an appt? Please advise.

## 2015-11-25 ENCOUNTER — Other Ambulatory Visit: Payer: Self-pay | Admitting: *Deleted

## 2015-11-25 MED ORDER — CLOPIDOGREL BISULFATE 75 MG PO TABS: 75.0000 mg | ORAL_TABLET | Freq: Every day | ORAL | Status: DC

## 2015-11-25 NOTE — Telephone Encounter (Signed)
**Note De-identified Divonte Senger Obfuscation** Yes. Thanks 

## 2015-11-25 NOTE — Telephone Encounter (Signed)
He should have his meds filled with his PMD since he has not been following up here.

## 2015-11-25 NOTE — Telephone Encounter (Signed)
Patient scheduled an appointment for 02/04/16, just wanted to clarify that he should continue the clopidogrel and ok to extend refill. Please advise. Thanks, MI

## 2015-11-27 ENCOUNTER — Other Ambulatory Visit: Payer: Self-pay | Admitting: Interventional Cardiology

## 2016-02-04 ENCOUNTER — Ambulatory Visit (INDEPENDENT_AMBULATORY_CARE_PROVIDER_SITE_OTHER): Payer: PPO | Admitting: Interventional Cardiology

## 2016-02-04 ENCOUNTER — Encounter: Payer: Self-pay | Admitting: Interventional Cardiology

## 2016-02-04 DIAGNOSIS — I10 Essential (primary) hypertension: Secondary | ICD-10-CM | POA: Diagnosis not present

## 2016-02-04 DIAGNOSIS — E7800 Pure hypercholesterolemia, unspecified: Secondary | ICD-10-CM

## 2016-02-04 DIAGNOSIS — I252 Old myocardial infarction: Secondary | ICD-10-CM | POA: Diagnosis not present

## 2016-02-04 DIAGNOSIS — I2510 Atherosclerotic heart disease of native coronary artery without angina pectoris: Secondary | ICD-10-CM | POA: Diagnosis not present

## 2016-02-04 DIAGNOSIS — Z87891 Personal history of nicotine dependence: Secondary | ICD-10-CM | POA: Insufficient documentation

## 2016-02-04 LAB — COMPREHENSIVE METABOLIC PANEL
ALK PHOS: 83 U/L (ref 40–115)
ALT: 19 U/L (ref 9–46)
AST: 16 U/L (ref 10–35)
Albumin: 3.9 g/dL (ref 3.6–5.1)
BUN: 14 mg/dL (ref 7–25)
CO2: 27 mmol/L (ref 20–31)
CREATININE: 1.06 mg/dL (ref 0.70–1.33)
Calcium: 9.2 mg/dL (ref 8.6–10.3)
Chloride: 105 mmol/L (ref 98–110)
GLUCOSE: 161 mg/dL — AB (ref 65–99)
Potassium: 4.2 mmol/L (ref 3.5–5.3)
SODIUM: 138 mmol/L (ref 135–146)
TOTAL PROTEIN: 6 g/dL — AB (ref 6.1–8.1)
Total Bilirubin: 0.3 mg/dL (ref 0.2–1.2)

## 2016-02-04 MED ORDER — ATORVASTATIN CALCIUM 80 MG PO TABS: 80.0000 mg | ORAL_TABLET | Freq: Every day | ORAL | Status: DC

## 2016-02-04 MED ORDER — CLOPIDOGREL BISULFATE 75 MG PO TABS: 75.0000 mg | ORAL_TABLET | Freq: Every day | ORAL | Status: DC

## 2016-02-04 MED ORDER — ENALAPRIL MALEATE 10 MG PO TABS: ORAL_TABLET | ORAL | Status: DC

## 2016-02-04 NOTE — Patient Instructions (Signed)

## 2016-02-04 NOTE — Progress Notes (Signed)
Patient ID: Jamie Rice, male   DOB: April 13, 1956, 60 y.o.   MRN: [id number]     Cardiology Office Note   Date:  02/04/2016   ID:  Jamie Rice, Jamie Rice 01/23/1956, MRN [id number]  PCP:  Donnie Coffin, MD    No chief complaint on file.  F/u CAD  Wt Readings from Last 3 Encounters:  02/04/16 201 lb (91.173 kg)  07/04/14 207 lb 6.4 oz (94.076 kg)  08/24/13 201 lb 11.5 oz (91.5 kg)       History of Present Illness: Jamie Rice is a 60 y.o. 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 He had a bare metal stent placed and did well. He had no chest pain. He felt hot and sweaty. No SHOB. He was taken to [facility] and had an emergency cath.No sx like his MI since that time.    Records from [facility] reviewed. He had a 4.0 x 20 bare-metal stent to the right coronary artery in October 2014. He had inferior and apical hypokinesis. Ejection fraction was preserved.   He had hip replacement surgery in 11/15.  He did well with that.    He walks in good weather.  He walks 25 minutes 5 days a week.  THe hip replacement has done very well. He does some stretching exercises.    Past Medical History  Diagnosis Date  . Hypercholesteremia   . HTN (hypertension)   . Depression   . Anxiety   . Arthritis   . Back pain   . CAD (coronary artery disease)     Past Surgical History  Procedure Laterality Date  . Hernia repair    . Vasectomy    . Ptca with stent - [facility]       Current Outpatient Prescriptions  Medication Sig Dispense Refill  . aspirin 81 MG tablet Take 81 mg by mouth daily.    Marland Kitchen atorvastatin (LIPITOR) 80 MG tablet TAKE 1 TABLET BY MOUTH ONCE DAILY 30 tablet 2  . Cinnamon 500 MG capsule Take 500 mg by mouth 2 (two) times daily.    . clopidogrel (PLAVIX) 75 MG tablet Take 1 tablet (75 mg total) by mouth daily. 30 tablet 2  . enalapril (VASOTEC) 10 MG tablet TAKE 1 TABLET (10 MG TOTAL) BY MOUTH DAILY. NEEDS APPOINTMENT 30 tablet 4  . NON FORMULARY Take 1  capsule by mouth every morning. TUMERIC    . Omega-3 Fatty Acids (FISH OIL PO) Take 1,200 mg by mouth 2 (two) times daily.      No current facility-administered medications for this visit.    Allergies:   Review of patient's allergies indicates no known allergies.    Social History:  The patient  reports that he has quit smoking. He does not have any smokeless tobacco history on file. He reports that he drinks alcohol. He reports that he does not use illicit drugs.   Family History:  The patient's family history includes Heart attack in his father; Heart disease in his father; Hypertension in his father and paternal uncle; Stroke in his father and paternal uncle.    ROS:  Please see the history of present illness.   Otherwise, review of systems are positive for easy bruising.   All other systems are reviewed and negative.    PHYSICAL EXAM: VS:  BP 125/70 mmHg  Pulse 63  Ht 5' 11.25" (1.81 m)  Wt 201 lb (91.173 kg)  BMI 27.83 kg/m2 , BMI Body mass index is  27.83 kg/(m^2). GEN: Well nourished, well developed, in no acute distress HEENT: normal Neck: no JVD, carotid bruits, or masses Cardiac: RRR; no murmurs, rubs, or gallops,no edema  Respiratory:  clear to auscultation bilaterally, normal work of breathing GI: soft, nontender, nondistended, + BS MS: no deformity or atrophy Skin: warm and dry, no rash Neuro:  Strength and sensation are intact Psych: euthymic mood, full affect   EKG:   The ekg ordered today demonstrates sinus bradycardia, no significant ST segment changes   Recent Labs: No results found for requested labs within last 365 days.   Lipid Panel    Component Value Date/Time   CHOL 146 10/03/2014 0734   TRIG 167.0* 10/03/2014 0734   HDL 32.90* 10/03/2014 0734   CHOLHDL 4 10/03/2014 0734   VLDL 33.4 10/03/2014 0734   LDLCALC 80 10/03/2014 0734   LDLDIRECT 123.6 07/04/2014 0829     Other studies Reviewed: Additional studies/ records that were reviewed  today with results demonstrating: [facility] records reviewed.   ASSESSMENT AND PLAN:  1. 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 Stop aspirin. Continue clopidogrel.  Status post bare-metal stent in the RCA back in 2014. 2. HTN: Controlled. COntinue enalapril. Change prescription to 90 day supply. 3. Hyperlipidemia: Checked with Dr. Alroy Dust.  He takes some cholestoff as well at times.  He feels a benefit from that.  He has changed his diet.  He avoids fast food.  He avoids red meat.  Continue atorvastatin also for 90 day supply. Will check kidney and liver function today given that we are prescribing an ACE inhibitor and statin. 4. Former smoker. He has not smoked for many years.   Current medicines are reviewed at length with the patient today.  The patient concerns regarding his medicines were addressed.  The following changes have been made:  Stop aspirin  Labs/ tests ordered today include:   Orders Placed This Encounter  Procedures  . EKG 12-Lead    Recommend 150 minutes/week of aerobic exercise Low fat, low carb, high fiber diet recommended  Disposition:   FU in One year   Teresita Madura., MD  02/04/2016 9:14 AM    [facility] [address], [place], [place]  [address] Phone: [phone]; Fax: [phone]

## 2016-03-02 ENCOUNTER — Other Ambulatory Visit: Payer: Self-pay | Admitting: Interventional Cardiology

## 2016-04-02 ENCOUNTER — Other Ambulatory Visit: Payer: Self-pay | Admitting: Interventional Cardiology

## 2016-08-31 DIAGNOSIS — M25512 Pain in left shoulder: Secondary | ICD-10-CM | POA: Diagnosis not present

## 2016-08-31 DIAGNOSIS — M25812 Other specified joint disorders, left shoulder: Secondary | ICD-10-CM | POA: Diagnosis not present

## 2016-09-08 DIAGNOSIS — R936 Abnormal findings on diagnostic imaging of limbs: Secondary | ICD-10-CM | POA: Diagnosis not present

## 2016-09-08 DIAGNOSIS — M19012 Primary osteoarthritis, left shoulder: Secondary | ICD-10-CM | POA: Diagnosis not present

## 2016-09-08 DIAGNOSIS — M25812 Other specified joint disorders, left shoulder: Secondary | ICD-10-CM | POA: Diagnosis not present

## 2016-09-08 DIAGNOSIS — M25512 Pain in left shoulder: Secondary | ICD-10-CM | POA: Diagnosis not present

## 2016-09-11 DIAGNOSIS — M25512 Pain in left shoulder: Secondary | ICD-10-CM | POA: Diagnosis not present

## 2017-02-05 ENCOUNTER — Other Ambulatory Visit: Payer: Self-pay | Admitting: Interventional Cardiology

## 2017-02-16 ENCOUNTER — Encounter: Payer: Self-pay | Admitting: Interventional Cardiology

## 2017-02-17 ENCOUNTER — Ambulatory Visit (INDEPENDENT_AMBULATORY_CARE_PROVIDER_SITE_OTHER): Payer: PPO | Admitting: Interventional Cardiology

## 2017-02-17 ENCOUNTER — Encounter: Payer: Self-pay | Admitting: Interventional Cardiology

## 2017-02-17 VITALS — BP 120/66 | HR 56 | Ht 71.25 in | Wt 197.0 lb

## 2017-02-17 DIAGNOSIS — I10 Essential (primary) hypertension: Secondary | ICD-10-CM

## 2017-02-17 DIAGNOSIS — I252 Old myocardial infarction: Secondary | ICD-10-CM | POA: Diagnosis not present

## 2017-02-17 DIAGNOSIS — E782 Mixed hyperlipidemia: Secondary | ICD-10-CM | POA: Diagnosis not present

## 2017-02-17 DIAGNOSIS — I2510 Atherosclerotic heart disease of native coronary artery without angina pectoris: Secondary | ICD-10-CM | POA: Diagnosis not present

## 2017-02-17 NOTE — Progress Notes (Signed)
Patient ID: Jamie Rice, male   DOB: December 30, 1955, 61 y.o.   MRN: [id number]     Cardiology Office Note   Date:  02/17/2017   ID:  Jamie Rice, Jamie Rice 1956/04/15, MRN [id number]  PCP:  Jamie Rice.Jamie Sa, MD    No chief complaint on file.  F/u CAD/Old MI  Wt Readings from Last 3 Encounters:  02/17/17 197 lb (89.4 kg)  02/04/16 201 lb (91.2 kg)  07/04/14 207 lb 6.4 oz (94.1 kg)       History of Present Illness: Jamie Rice is a 61 y.o. 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 He had a bare metal stent placed and did well. He had no chest pain. He felt hot and sweaty. No SHOB. He was taken to [facility] and had an emergency cath.No sx like his MI since that time.    Records from [facility] reviewed. He had a 4.0 x 20 bare-metal stent to the right coronary artery in October 2014. He had inferior and apical hypokinesis. Ejection fraction was preserved.   He had hip replacement surgery in 11/15.  He did well with that.    He walks in good weather.  He walks 25 minutes 5 days a week. He has been sick for the last 4 days with nausea and diarrhea.  He has not been active.      Past Medical History:  Diagnosis Date  . Anxiety   . Arthritis   . Back pain   . CAD (coronary artery disease)   . Depression   . HTN (hypertension)   . Hypercholesteremia     Past Surgical History:  Procedure Laterality Date  . HERNIA REPAIR    . PTCA with stent - [facility]    . VASECTOMY       Current Outpatient Prescriptions  Medication Sig Dispense Refill  . atorvastatin (LIPITOR) 80 MG tablet Take 1 tablet (80 mg total) by mouth daily. 90 tablet 3  . Cinnamon 500 MG capsule Take 500 mg by mouth 2 (two) times daily.    . clopidogrel (PLAVIX) 75 MG tablet Take 1 tablet (75 mg total) by mouth daily. 90 tablet 3  . enalapril (VASOTEC) 10 MG tablet TAKE 1 TABLET BY MOUTH DAILY. 90 tablet 0  . NON FORMULARY Take 1 capsule by mouth every morning. TUMERIC    . Omega-3 Fatty  Acids (FISH OIL PO) Take 1,200 mg by mouth 2 (two) times daily.      No current facility-administered medications for this visit.     Allergies:   Patient has no known allergies.    Social History:  The patient  reports that he has quit smoking. He has never used smokeless tobacco. He reports that he drinks alcohol. He reports that he does not use drugs.   Family History:  The patient's family history includes Heart attack in his father; Heart disease in his father; Hypertension in his father and paternal uncle; Stroke in his father and paternal uncle.    ROS:  Please see the history of present illness.   Otherwise, review of systems are positive for easy bruising.   All other systems are reviewed and negative.    PHYSICAL EXAM: VS:  BP 120/66 (BP Location: Left Arm, Patient Position: Sitting, Cuff Size: Normal)   Pulse (!) 56   Ht 5' 11.25" (1.81 m)   Wt 197 lb (89.4 kg)   SpO2 95%   BMI 27.28 kg/m  , BMI Body mass  index is 27.28 kg/m. GEN: Well nourished, well developed, in no acute distress  HEENT: normal  Neck: no JVD, carotid bruits, or masses Cardiac: RRR; no murmurs, rubs, or gallops,no edema  Respiratory:  clear to auscultation bilaterally, normal work of breathing GI: soft, nontender, nondistended, + BS MS: no deformity or atrophy  Skin: warm and dry, no rash Neuro:  Strength and sensation are intact Psych: euthymic mood, full affect   EKG:   The ekg ordered today demonstrates sinus bradycardia, no significant ST segment changes   Recent Labs: No results found for requested labs within last 8760 hours.   Lipid Panel    Component Value Date/Time   CHOL 146 10/03/2014 0734   TRIG 167.0 (H) 10/03/2014 0734   HDL 32.90 (L) 10/03/2014 0734   CHOLHDL 4 10/03/2014 0734   VLDL 33.4 10/03/2014 0734   LDLCALC 80 10/03/2014 0734   LDLDIRECT 123.6 07/04/2014 0829     Other studies Reviewed: Additional studies/ records that were reviewed today with results  demonstrating: labs pending    ASSESSMENT AND PLAN:  1. Old MI/CAD: No CHF or angina on medical therapy. Stop aspirin. Continue clopidogrel.  Status post bare-metal stent in the RCA back in 2014. 2. HTN: well Controlled. COntinue enalapril.  3. Hyperlipidemia: Checked with Dr. Alroy Rice.  Will get most recent results.  He has changed his diet.  He avoids fast food.  He avoids red meat.  Eats a lot of chicken. Continue atorvastatin. Former smoker. He has not smoked for many years. 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  I advised him that if he is still having his GI illness with nausea and diarrhea, he should not work on Monday.     Current medicines are reviewed at length with the patient today.  The patient concerns regarding his medicines were addressed.  The following changes have been made:  none  Labs/ tests ordered today include:   No orders of the defined types were placed in this encounter.   Recommend 150 minutes/week of aerobic exercise Low fat, low carb, high fiber diet recommended  Disposition:   FU in One year   Signed, Larae Grooms, MD  02/17/2017 4:34 PM    [facility] [address], [place], [place]  [address] Phone: [phone]; Fax: [phone]

## 2017-02-17 NOTE — Patient Instructions (Signed)

## 2017-04-15 ENCOUNTER — Other Ambulatory Visit: Payer: Self-pay | Admitting: Interventional Cardiology

## 2017-06-09 ENCOUNTER — Other Ambulatory Visit: Payer: Self-pay | Admitting: Interventional Cardiology

## 2017-06-19 DIAGNOSIS — R42 Dizziness and giddiness: Secondary | ICD-10-CM | POA: Diagnosis not present

## 2017-08-31 DIAGNOSIS — E119 Type 2 diabetes mellitus without complications: Secondary | ICD-10-CM | POA: Diagnosis not present

## 2017-08-31 DIAGNOSIS — H4720 Unspecified optic atrophy: Secondary | ICD-10-CM | POA: Diagnosis not present

## 2017-08-31 DIAGNOSIS — H5203 Hypermetropia, bilateral: Secondary | ICD-10-CM | POA: Diagnosis not present

## 2018-02-02 ENCOUNTER — Encounter: Payer: Self-pay | Admitting: Interventional Cardiology

## 2018-02-19 ENCOUNTER — Other Ambulatory Visit: Payer: Self-pay | Admitting: Interventional Cardiology

## 2018-02-23 ENCOUNTER — Ambulatory Visit: Payer: PPO | Admitting: Interventional Cardiology

## 2018-04-05 NOTE — Progress Notes (Signed)
Cardiology Office Note   Date:  04/06/2018   ID:  Jamie Rice, Jamie Rice Feb 29, 1956, MRN [id number]  PCP:  Alroy Dust, Carlean Jews.Marlou Sa, MD    No chief complaint on file.  CAD  Wt Readings from Last 3 Encounters:  04/06/18 208 lb (94.3 kg)  02/17/17 197 lb (89.4 kg)  02/04/16 201 lb (91.2 kg)       History of Present Illness: Jamie Rice is a 62 y.o. 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 He had a bare metal stent placed and did well. He had no chest pain. He felt hot and sweaty. No SHOB. He was taken to [facility] and had an emergency cath.No sx like his MI since that time.    Records from [facility] reviewed. He had a 4.0 x 20 bare-metal stent to the right coronary artery in October 2014. He had inferior and apical hypokinesis. Ejection fraction was preserved.   He had hip replacement surgery in 11/15.  He did well with that.   His father had CABG 30+ years ago, and is doing well in his 29s.  He swims and walks daily, in good weather.  He does not do a lot of weight lifting due to shoulder problems.  He will need left shoulder surgery coming up.    Denies : Chest pain. Dizziness. Nitroglycerin use. Orthopnea. Palpitations. Paroxysmal nocturnal dyspnea. Shortness of breath. Syncope.   Occasional leg edema when he stands for 7-8 hours.   BP at home is typically in the 120-130 range today.   Past Medical History:  Diagnosis Date  . Anxiety   . Arthritis   . Back pain   . CAD (coronary artery disease)   . Depression   . HTN (hypertension)   . Hypercholesteremia     Past Surgical History:  Procedure Laterality Date  . HERNIA REPAIR    . PTCA with stent - [facility]    . VASECTOMY       Current Outpatient Medications  Medication Sig Dispense Refill  . atorvastatin (LIPITOR) 80 MG tablet Take 1 tablet (80 mg total) by mouth daily. Please keep upcoming appt for future refills. Thank you 90 tablet 0  . Cinnamon 500 MG capsule Take 500 mg by mouth 2  (two) times daily.    . clopidogrel (PLAVIX) 75 MG tablet Take 1 tablet (75 mg total) by mouth daily. Please keep upcoming appt for future refills. Thank you 90 tablet 0  . enalapril (VASOTEC) 10 MG tablet Take 1 tablet (10 mg total) by mouth daily. 90 tablet 2  . NON FORMULARY Take 1 capsule by mouth every morning. TUMERIC    . Omega-3 Fatty Acids (FISH OIL PO) Take 1,200 mg by mouth 2 (two) times daily.      No current facility-administered medications for this visit.     Allergies:   Patient has no known allergies.    Social History:  The patient  reports that he has quit smoking. He has never used smokeless tobacco. He reports that he drinks alcohol. He reports that he does not use drugs.   Family History:  The patient's family history includes Heart attack in his father; Heart disease in his father; Hypertension in his father and paternal uncle; Stroke in his father and paternal uncle.    ROS:  Please see the history of present illness.   Otherwise, review of systems are positive for nuisance bleeding problems; weight gain.   All other systems are reviewed and  negative.    PHYSICAL EXAM: VS:  BP (!) 144/78   Pulse (!) 55   Ht 5' 11.5" (1.816 m)   Wt 208 lb (94.3 kg)   SpO2 97%   BMI 28.61 kg/m  , BMI Body mass index is 28.61 kg/m. GEN: Well nourished, well developed, in no acute distress  HEENT: normal  Neck: no JVD, carotid bruits, or masses Cardiac: RRR; no murmurs, rubs, or gallops,no edema ; 2+ DP pulses bilaterally Respiratory:  clear to auscultation bilaterally, normal work of breathing GI: soft, nontender, nondistended, + BS MS: no deformity or atrophy  Skin: warm and dry, no rash Neuro:  Strength and sensation are intact Psych: euthymic mood, full affect   EKG:   The ekg ordered today demonstrates sinus bradycardia, no ST changes   Recent Labs: No results found for requested labs within last 8760 hours.   Lipid Panel    Component Value Date/Time   CHOL  146 10/03/2014 0734   TRIG 167.0 (H) 10/03/2014 0734   HDL 32.90 (L) 10/03/2014 0734   CHOLHDL 4 10/03/2014 0734   VLDL 33.4 10/03/2014 0734   LDLCALC 80 10/03/2014 0734   LDLDIRECT 123.6 07/04/2014 0829     Other studies Reviewed: Additional studies/ records that were reviewed today with results demonstrating: LDL 44 in 2016.   ASSESSMENT AND PLAN:  1. CAD/Old MI: No angina on aggressive medical therapy.  Continue clopidogrel.  COntinue to hold aspirin at this time.  He reports nuisance bleeding.  Healthy diet as noted below.  No CHF sx. 2. HTN: COntrolled at home.  COntinue to monitor.  If systolic stays above 378, may need additional enalapril.  He will see what reading is with Dr. Alroy Dust. 3. Hyperlipidemia: Checked with Dr. Alroy Dust.  He will have a fasting appt with him next month.   Current medicines are reviewed at length with the patient today.  The patient concerns regarding his medicines were addressed.  The following changes have been made:  No change  Labs/ tests ordered today include:  No orders of the defined types were placed in this encounter.   Recommend 150 minutes/week of aerobic exercise Low fat, low carb, high fiber diet recommended  Disposition:   FU in 1 year   Signed, Larae Grooms, MD  04/06/2018 9:06 AM    [facility] [address], [place], [place]  [address] Phone: [phone]; Fax: [phone]

## 2018-04-06 ENCOUNTER — Ambulatory Visit: Payer: PPO | Admitting: Interventional Cardiology

## 2018-04-06 ENCOUNTER — Encounter: Payer: Self-pay | Admitting: Interventional Cardiology

## 2018-04-06 VITALS — BP 144/78 | HR 55 | Ht 71.5 in | Wt 208.0 lb

## 2018-04-06 DIAGNOSIS — E782 Mixed hyperlipidemia: Secondary | ICD-10-CM

## 2018-04-06 DIAGNOSIS — I252 Old myocardial infarction: Secondary | ICD-10-CM | POA: Diagnosis not present

## 2018-04-06 DIAGNOSIS — I10 Essential (primary) hypertension: Secondary | ICD-10-CM | POA: Diagnosis not present

## 2018-04-06 DIAGNOSIS — I2510 Atherosclerotic heart disease of native coronary artery without angina pectoris: Secondary | ICD-10-CM

## 2018-04-06 NOTE — Patient Instructions (Signed)

## 2018-04-21 ENCOUNTER — Other Ambulatory Visit: Payer: Self-pay | Admitting: Interventional Cardiology

## 2018-05-28 ENCOUNTER — Other Ambulatory Visit: Payer: Self-pay | Admitting: Interventional Cardiology

## 2018-06-28 ENCOUNTER — Other Ambulatory Visit: Payer: Self-pay

## 2018-06-28 NOTE — Patient Outreach (Signed)
[facility]) Care Management  06/28/2018  Jamie Rice 27-Jan-1956 [id number]  TELEPHONE SCREENING Referral date: 06/20/18 Referral source:  [facility] Referral reason:  Care coordination assistance, community assistance for home repairs.  Insurance: Health team advantage Attempt #1  Telephone call to patient regarding referral. Unable to reach patient. HIPAA compliant voice message left with call back phone number.   PLAN: RNCM will attempt 2nd telephone call to patient within 4 business days. RNCM will send outreach letter.   Quinn Plowman RN,BSN, [place] Telephonic  [phone]

## 2018-07-04 ENCOUNTER — Other Ambulatory Visit: Payer: Self-pay

## 2018-07-04 NOTE — Patient Outreach (Signed)
[facility]) Care Management  07/04/2018  Jamie Rice 07/12/1956 [id number]  TELEPHONE SCREENING Referral date: 06/20/18 Referral source:  [facility] Referral reason:  Care coordination assistance, community assistance for home repairs.  Insurance: Health team advantage Attempt #2  Telephone call to patient regarding referral. Unable to reach patient. HIPAA compliant voice message left with call back phone number.   PLAN: RNCM will attempt 3rd  telephone call to patient within 4 business days.   Quinn Plowman RN,BSN, [place] Telephonic  [phone]

## 2018-07-06 ENCOUNTER — Other Ambulatory Visit: Payer: Self-pay

## 2018-07-06 NOTE — Patient Outreach (Signed)
[facility]) Care Management  07/06/2018  RAYHAN GROLEAU [birth year] [id number]   TELEPHONE SCREENING Referral date:06/20/18 Referral source:[facility] Referral reason:Care coordination assistance, community assistance for home repairs. Insurance:Health team advantage Attempt #3  Telephone call to patient regarding referral. Unable to reach patient. HIPAA compliant voice message left with call back phone number.   PLAN: If no return call will proceed with closure   Woody Seller, [place] Telephonic  [phone]

## 2018-07-10 NOTE — Progress Notes (Signed)
[facility] [place], [place] [address] Phone: [phone] Subjective:    I Jamie Rice am serving as a Education administrator for Dr. Hulan Rice.   I'm seeing this patient by the request  of:  Jamie Rice, JamieMarlou Sa, MD   CC: Right knee pain  [id number]  Jamie Rice is a 62 y.o. male coming in with complaint of right knee pain. States he woke up with pain. Could hardly walk on it. No numbness and tingling noted. Pain radiates to his glut.  Onset- 1 week ago Location- Medial  Character- Constant pain Aggravating factors- walking, flexion Reliving factors- Ice, heat, medication Therapies tried-as above with minimal improvement Severity-8 out of 10     Past Medical History:  Diagnosis Date  . Anxiety   . Arthritis   . Back pain   . CAD (coronary artery disease)   . Depression   . HTN (hypertension)   . Hypercholesteremia    Past Surgical History:  Procedure Laterality Date  . HERNIA REPAIR    . PTCA with stent - [facility]    . VASECTOMY     Social History   Socioeconomic History  . Marital status: Widowed    Spouse name: Not on file  . Number of children: Not on file  . Years of education: Not on file  . Highest education level: Not on file  Occupational History  . Not on file  Social Needs  . Financial resource strain: Not on file  . Food insecurity:    Worry: Not on file    Inability: Not on file  . Transportation needs:    Medical: Not on file    Non-medical: Not on file  Tobacco Use  . Smoking status: Former Research scientist (life sciences)  . Smokeless tobacco: Never Used  Substance and Sexual Activity  . Alcohol use: Yes  . Drug use: No  . Sexual activity: Not on file  Lifestyle  . Physical activity:    Days per week: Not on file    Minutes per session: Not on file  . Stress: Not on file  Relationships  . Social connections:    Talks on phone: Not on file    Gets together: Not on file    Attends religious service: Not on file      Active member of club or organization: Not on file    Attends meetings of clubs or organizations: Not on file    Relationship status: Not on file  Other Topics Concern  . Not on file  Social History Narrative  . Not on file   No Known Allergies Family History  Problem Relation Age of Onset  . Heart disease Father   . Heart attack Father   . Hypertension Father   . Stroke Father        X4  . Hypertension Paternal Uncle        X4  . Stroke Paternal Uncle      Current Outpatient Medications (Cardiovascular):  .  atorvastatin (LIPITOR) 80 MG tablet, Take 1 tablet (80 mg total) by mouth daily. .  enalapril (VASOTEC) 10 MG tablet, TAKE ONE TABLET BY MOUTH ONE TIME DAILY     Current Outpatient Medications (Hematological):  .  clopidogrel (PLAVIX) 75 MG tablet, Take 1 tablet (75 mg total) by mouth daily.  Current Outpatient Medications (Other):  .  Cinnamon 500 MG capsule, Take 500 mg by mouth 2 (two) times daily. .  NON FORMULARY, Take 1 capsule  by mouth every morning. TUMERIC .  Omega-3 Fatty Acids (FISH OIL PO), Take 1,200 mg by mouth 2 (two) times daily.     Past medical history, social, surgical and family history all reviewed in electronic medical record.  No pertanent information unless stated regarding to the chief complaint.   Review of Systems:  No headache, visual changes, nausea, vomiting, diarrhea, constipation, dizziness, abdominal pain, skin rash, fevers, chills, night sweats, weight loss, swollen lymph nodes, body aches,  chest pain, shortness of breath, mood changes.  Positive muscle aches, joint swelling  Objective  Blood pressure 118/70, pulse (!) 56, height 5\' 11"  (1.803 m), weight 217 lb (98.4 kg), SpO2 95 %.   General: No apparent distress alert and oriented x3 mood and affect normal, dressed appropriately.  HEENT: Pupils equal, extraocular movements intact  Respiratory: Patient's speak in full sentences and does not appear short of breath   Cardiovascular: No lower extremity edema, non tender, no erythema  Skin: Warm dry intact with no signs of infection or rash on extremities or on axial skeleton.  Abdomen: Soft nontender  Neuro: Cranial nerves II through XII are intact, neurovascularly intact in all extremities with 2+ DTRs and 2+ pulses.  Lymph: No lymphadenopathy of posterior or anterior cervical chain or axillae bilaterally.  Gait antalgic MSK:  Non tender with full range of motion and good stability and symmetric strength and tone of shoulders, elbows, wrist, hip and ankles bilaterally.  Knee: Right Normal to inspection with no erythema or effusion or obvious bony abnormalities. Palpation normal with no warmth, joint line tenderness, patellar tenderness, or condyle tenderness. ROM full in flexion and extension and lower leg rotation. Ligaments with solid consistent endpoints including ACL, PCL, LCL, MCL. Negative Mcmurray's, Apley's, and Thessalonian tests. painful patellar compression. Patellar glide with mild to moderate crepitus. Patellar and quadriceps tendons unremarkable. Hamstring and quadriceps strength is normal.  Severe pain over the medial aspect of the hamstring.  MSK US performed of: Right knee This study was ordered, performed, and interpreted by Jamie Rice D.O.  Knee: All structures visualized. Mild degenerative changes of all 3 compartments.  Patient's medial meniscus does have a degenerative tear but no significant displacement.  Mild swelling around the MCL but no true tear Patellar Tendon unremarkable on long and transverse views without effusion. No abnormality of prepatellar bursa. Patient's distal hamstring though does have what appears to be a tear with increasing Doppler flow noted.Marland Kitchen  IMPRESSION: Distal hamstring tear.    Impression and Recommendations:     This case required medical decision making of moderate complexity. The above documentation has been reviewed and is accurate and  complete Jamie Pulley, DO       Note: This dictation was prepared with Dragon dictation along with smaller phrase technology. Any transcriptional errors that result from this process are unintentional.

## 2018-07-11 ENCOUNTER — Encounter: Payer: Self-pay | Admitting: Family Medicine

## 2018-07-11 ENCOUNTER — Other Ambulatory Visit: Payer: Self-pay

## 2018-07-11 ENCOUNTER — Ambulatory Visit: Payer: PPO | Admitting: Family Medicine

## 2018-07-11 ENCOUNTER — Ambulatory Visit: Payer: Self-pay

## 2018-07-11 VITALS — BP 118/70 | HR 56 | Ht 71.0 in | Wt 217.0 lb

## 2018-07-11 DIAGNOSIS — M25561 Pain in right knee: Secondary | ICD-10-CM

## 2018-07-11 DIAGNOSIS — S76311A Strain of muscle, fascia and tendon of the posterior muscle group at thigh level, right thigh, initial encounter: Secondary | ICD-10-CM

## 2018-07-11 NOTE — Assessment & Plan Note (Signed)
Distal hamstring strain.  Patient does have what appears to be a sprain of the MCL as well as the meniscus but no true tear appreciated.  Patient to get compression sleeve, home exercise, topical anti-inflammatories and disc changes that patient can do throughout the day.  Follow-up with me again in 4 weeks

## 2018-07-11 NOTE — Patient Outreach (Signed)
[facility]) Care Management  07/11/2018  PEIGHTON EDGIN [birth year] [id number]  TELEPHONE SCREENING Referral date:06/20/18 Referral source:[employer] Referral reason:Care coordination assistance, community assistance for home repairs. Insurance:[employer] team advantage Return call attempt  Telephone call to patient regarding referral. Unable to reach patient. HIPAA compliant voice message left with call back phone number.   PLAN: If no return call will proceed with closure   Woody Seller, [place] Telephonic  [phone]

## 2018-07-11 NOTE — Patient Instructions (Signed)
Good to see you  Hamstring tear   Get a thigh compression sleeve and wear daily  Short strides and careful going down stairs Ice 20 minutes 2 times daily. Usually after activity and before bed. Starting next week Exercises 3 times a week.   pennsaid pinkie amount topically 2 times daily as needed.   See me again in 3 weeks to make sure you are better

## 2018-07-11 NOTE — Patient Outreach (Signed)
[facility]) Care Management  07/11/2018  Jamie Rice 03/07/1956 [id number]  TELEPHONE SCREENING Referral date:06/20/18 Referral source:EP[employer]OURCE Referral reason:Care coordination assistance, community assistance for home repairs. Insurance:Health team advantage  Telephone call to patient regarding [employer] referral. HIPAA verified with patient. Explained reason for call.  Patient states he explained to the nurse practitioner that came out for his evaluation that he was unable to do some things like he use to such has getting up on a ladder and cleaning his gutters.  Patient states he is unable to do things that require him lifting his arms up over his head due to his rotator cuff issue. Patient states he is able to care for himself completely.  Patient states he can clean his home and mow his lawn without problem.  Patient states he had physical therapy after surgery but was not diligent in doing his home exercises consistently.  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  Patient reports his next follow up visit with his primary doctor is the end of October 2019.  RNCM advised patient to address his concerns with his doctor arm/ shoulder/ neck symptoms.   Discussed with patient to discuss with doctor whether additional rehab therapy would be beneficial. Patient  Verbalized understanding.  RNCM discussed and offered [facility] care management services. Patient declined stating he was doing fine and did not need services at this time.  RNCM offered to send patient [facility] care management brochure/ magnet. Patient verbalized agreement and appreciation.  PLAN;  RNCM will close patient due to refusal of services.  RNCM will send patient [facility] care management brochure / magnet RNCM will send patients primary MD closure notification   Quinn Plowman RN,BSN,CCM [facility] Telephonic   [phone]

## 2018-07-15 ENCOUNTER — Ambulatory Visit: Payer: Self-pay

## 2018-07-30 NOTE — Progress Notes (Signed)
[name] [name], [name] [address] Phone: [phone] Subjective:   [name], am serving as a scribe for Dr. Hulan Saas.   CC: Right knee pain follow-up  HAL:[id number]  Jamie Rice is a 62 y.o. male coming in with complaint of knee pain. No change since last visit. Constant dull ache with sharp pain at times. Brace does help. Still having pain  No better really. Hurts all the time, on a blood thinner.   Patient states that unfortunately he feels that in the same area severe pain when he pushes in the area.       Past Medical History:  Diagnosis Date  . Anxiety   . Arthritis   . Back pain   . CAD (coronary artery disease)   . Depression   . HTN (hypertension)   . Hypercholesteremia    Past Surgical History:  Procedure Laterality Date  . HERNIA REPAIR    . PTCA with stent - [place]    . VASECTOMY     Social History   Socioeconomic History  . Marital status: Widowed    Spouse name: Not on file  . Number of children: Not on file  . Years of education: Not on file  . Highest education level: Not on file  Occupational History  . Not on file  Social Needs  . Financial resource strain: Not on file  . Food insecurity:    Worry: Not on file    Inability: Not on file  . Transportation needs:    Medical: Not on file    Non-medical: Not on file  Tobacco Use  . Smoking status: Former Research scientist (life sciences)  . Smokeless tobacco: Never Used  Substance and Sexual Activity  . Alcohol use: Yes  . Drug use: No  . Sexual activity: Not on file  Lifestyle  . Physical activity:    Days per week: Not on file    Minutes per session: Not on file  . Stress: Not on file  Relationships  . Social connections:    Talks on phone: Not on file    Gets together: Not on file    Attends religious service: Not on file    Active member of club or organization: Not on file    Attends meetings of clubs or organizations: Not on file   Relationship status: Not on file  Other Topics Concern  . Not on file  Social History Narrative  . Not on file   No Known Allergies Family History  Problem Relation Age of Onset  . Heart disease Father   . Heart attack Father   . Hypertension Father   . Stroke Father        X4  . Hypertension Paternal Uncle        X4  . Stroke Paternal Uncle      Current Outpatient Medications (Cardiovascular):  .  atorvastatin (LIPITOR) 80 MG tablet, Take 1 tablet (80 mg total) by mouth daily. .  enalapril (VASOTEC) 10 MG tablet, TAKE ONE TABLET BY MOUTH ONE TIME DAILY     Current Outpatient Medications (Hematological):  .  clopidogrel (PLAVIX) 75 MG tablet, Take 1 tablet (75 mg total) by mouth daily.  Current Outpatient Medications (Other):  .  Cinnamon 500 MG capsule, Take 500 mg by mouth 2 (two) times daily. .  NON FORMULARY, Take 1 capsule by mouth every morning. TUMERIC .  Omega-3 Fatty Acids (FISH OIL PO), Take 1,200 mg  by mouth 2 (two) times daily.     Past medical history, social, surgical and family history all reviewed in electronic medical record.  No pertanent information unless stated regarding to the chief complaint.   Review of Systems:  No headache, visual changes, nausea, vomiting, diarrhea, constipation, dizziness, abdominal pain, skin rash, fevers, chills, night sweats, weight loss, swollen lymph nodes, body aches, joint swelling, muscle aches, chest pain, shortness of breath, mood changes.   Objective  There were no vitals taken for this visit. Systems examined below as of    General: No apparent distress alert and oriented x3 mood and affect normal, dressed appropriately.  HEENT: Pupils equal, extraocular movements intact  Respiratory: Patient's speak in full sentences and does not appear short of breath  Cardiovascular: No lower extremity edema, non tender, no erythema  Skin: Warm dry intact with no signs of infection or rash on extremities or on axial skeleton.   Abdomen: Soft nontender  Neuro: Cranial nerves II through XII are intact, neurovascularly intact in all extremities with 2+ DTRs and 2+ pulses.  Lymph: No lymphadenopathy of posterior or anterior cervical chain or axillae bilaterally.  Gait antalgic gait MSK:  Non tender with full range of motion and good stability and symmetric strength and tone of shoulders, elbows, wrist, hip, and ankles bilaterally.  Right knee exam shows the patient does lack the last 5 degrees of flexion.  Severely tender to palpation still over the distal hamstring, medial joint line in the posterior popliteal area medially.  More of the fullness noted today over the popliteal area than previously.  Limited musculoskeletal ultrasound was performed and interpreted by [name]  Limited ultrasound of the posterior aspect of the knee shows the patient does have a Baker's cyst.  Some possible debris in it.  Patient's medial meniscus does have some degenerative changes.  Moderate arthritic changes.  Procedure: Real-time Ultrasound Guided Injection of right Baker's cyst Device: GE Logiq Q7 Ultrasound guided injection is preferred based studies that show increased duration, increased effect, greater accuracy, decreased procedural pain, increased response rate, and decreased cost with ultrasound guided versus blind injection.  Verbal informed consent obtained.  Time-out conducted.  Noted no overlying erythema, induration, or other signs of local infection.  Skin prepped in a sterile fashion.  Local anesthesia: Topical Ethyl chloride.  With sterile technique and under real time ultrasound guidance: With a 22-gauge 2 inch needle patient was injected with 4 cc of 0.5% Marcaine and aspirated 8 cc of straw-colored fluid with debris and then injected 1 cc of Kenalog 40 mg/dL. This was from a posterior approach.  Completed without difficulty  Pain immediately resolved suggesting accurate placement of the medication.  Advised to  call if fevers/chills, erythema, induration, drainage, or persistent bleeding.  Images permanently stored and available for review in the ultrasound unit.  Impression: Technically successful ultrasound guided injection.    Impression and Recommendations:     This case required medical decision making of moderate complexity. The above documentation has been reviewed and is accurate and complete [name], DO       Note: This dictation was prepared with Dragon dictation along with smaller phrase technology. Any transcriptional errors that result from this process are unintentional.

## 2018-08-01 ENCOUNTER — Other Ambulatory Visit: Payer: PPO

## 2018-08-01 ENCOUNTER — Ambulatory Visit: Payer: PPO | Admitting: Family Medicine

## 2018-08-01 ENCOUNTER — Ambulatory Visit: Payer: Self-pay

## 2018-08-01 ENCOUNTER — Other Ambulatory Visit: Payer: Self-pay

## 2018-08-01 VITALS — BP 132/76 | HR 70 | Ht 71.0 in | Wt 207.0 lb

## 2018-08-01 DIAGNOSIS — I2510 Atherosclerotic heart disease of native coronary artery without angina pectoris: Secondary | ICD-10-CM | POA: Diagnosis not present

## 2018-08-01 DIAGNOSIS — G8929 Other chronic pain: Secondary | ICD-10-CM

## 2018-08-01 DIAGNOSIS — Z125 Encounter for screening for malignant neoplasm of prostate: Secondary | ICD-10-CM | POA: Diagnosis not present

## 2018-08-01 DIAGNOSIS — M25561 Pain in right knee: Principal | ICD-10-CM

## 2018-08-01 DIAGNOSIS — E7800 Pure hypercholesterolemia, unspecified: Secondary | ICD-10-CM | POA: Diagnosis not present

## 2018-08-01 DIAGNOSIS — Z1211 Encounter for screening for malignant neoplasm of colon: Secondary | ICD-10-CM | POA: Diagnosis not present

## 2018-08-01 DIAGNOSIS — M7121 Synovial cyst of popliteal space [Baker], right knee: Secondary | ICD-10-CM

## 2018-08-01 DIAGNOSIS — E119 Type 2 diabetes mellitus without complications: Secondary | ICD-10-CM | POA: Diagnosis not present

## 2018-08-01 DIAGNOSIS — Z0000 Encounter for general adult medical examination without abnormal findings: Secondary | ICD-10-CM | POA: Diagnosis not present

## 2018-08-01 DIAGNOSIS — Z23 Encounter for immunization: Secondary | ICD-10-CM | POA: Diagnosis not present

## 2018-08-01 NOTE — Assessment & Plan Note (Signed)
Injected today  Send fluid to the lab.  Discussed compression If return due to amount of pain will get MRI to further evaluate

## 2018-08-01 NOTE — Patient Instructions (Signed)
Good to see you   Ice 20 minutes 2 times daily. Usually after activity and before bed.  Knee compression sleeve would be good. Would do it daily look atdicks.  See me again in 3 weeks to see how you are doing  If not better in a week. Call and we will order MRI

## 2018-08-02 LAB — SYNOVIAL CELL COUNT + DIFF, W/ CRYSTALS
Basophils, %: 0 %
Eosinophils-Synovial: 0 % (ref 0–2)
LYMPHOCYTES-SYNOVIAL FLD: 53 % (ref 0–74)
MONOCYTE/MACROPHAGE: 44 % (ref 0–69)
Neutrophil, Synovial: 3 % (ref 0–24)
Synoviocytes, %: 0 % (ref 0–15)
WBC, SYNOVIAL: 38 {cells}/uL (ref <150)

## 2018-08-19 NOTE — Progress Notes (Signed)
[facility] [place], [place] [address] Phone: [phone] Subjective:   [name], am serving as a scribe for Dr. Hulan Saas.   CC: Right knee pain  [id number]  Jamie Rice is a 62 y.o. male coming in with complaint of right knee pain. He states that he continues to have tenderness to the touch in posterior knee. Is now experiencing swelling in calf. Had relief after injection. Having trouble sleeping at night.  Patient states that it is severe again.  States that it is worse at night.  Rates the severity pain is 8 out of 10.  Nothing seems to alleviate it when it starts to hurt.     Past Medical History:  Diagnosis Date  . Anxiety   . Arthritis   . Back pain   . CAD (coronary artery disease)   . Depression   . HTN (hypertension)   . Hypercholesteremia    Past Surgical History:  Procedure Laterality Date  . HERNIA REPAIR    . PTCA with stent - [place]    . VASECTOMY     Social History   Socioeconomic History  . Marital status: Widowed    Spouse name: Not on file  . Number of children: Not on file  . Years of education: Not on file  . Highest education level: Not on file  Occupational History  . Not on file  Social Needs  . Financial resource strain: Not on file  . Food insecurity:    Worry: Not on file    Inability: Not on file  . Transportation needs:    Medical: Not on file    Non-medical: Not on file  Tobacco Use  . Smoking status: Former Research scientist (life sciences)  . Smokeless tobacco: Never Used  Substance and Sexual Activity  . Alcohol use: Yes  . Drug use: No  . Sexual activity: Not on file  Lifestyle  . Physical activity:    Days per week: Not on file    Minutes per session: Not on file  . Stress: Not on file  Relationships  . Social connections:    Talks on phone: Not on file    Gets together: Not on file    Attends religious service: Not on file    Active member of club or organization: Not on  file    Attends meetings of clubs or organizations: Not on file    Relationship status: Not on file  Other Topics Concern  . Not on file  Social History Narrative  . Not on file   No Known Allergies Family History  Problem Relation Age of Onset  . Heart disease Father   . Heart attack Father   . Hypertension Father   . Stroke Father        X4  . Hypertension Paternal Uncle        X4  . Stroke Paternal Uncle      Current Outpatient Medications (Cardiovascular):  .  atorvastatin (LIPITOR) 80 MG tablet, Take 1 tablet (80 mg total) by mouth daily. .  enalapril (VASOTEC) 10 MG tablet, TAKE ONE TABLET BY MOUTH ONE TIME DAILY     Current Outpatient Medications (Hematological):  .  clopidogrel (PLAVIX) 75 MG tablet, Take 1 tablet (75 mg total) by mouth daily.  Current Outpatient Medications (Other):  .  Cinnamon 500 MG capsule, Take 500 mg by mouth 2 (two) times daily. .  NON FORMULARY, Take 1 capsule by mouth  every morning. TUMERIC .  Omega-3 Fatty Acids (FISH OIL PO), Take 1,200 mg by mouth 2 (two) times daily.     Past medical history, social, surgical and family history all reviewed in electronic medical record.  No pertanent information unless stated regarding to the chief complaint.   Review of Systems:  No headache, visual changes, nausea, vomiting, diarrhea, constipation, dizziness, abdominal pain, skin rash, fevers, chills, night sweats, weight loss, swollen lymph nodes, body aches, joint swelling,  chest pain, shortness of breath, mood changes.  Positive muscle aches  Objective  Blood pressure 122/82, pulse 71, height 5\' 11"  (1.803 m), weight 203 lb (92.1 kg), SpO2 98 %.    General: No apparent distress alert and oriented x3 mood and affect normal, dressed appropriately.  HEENT: Pupils equal, extraocular movements intact  Respiratory: Patient's speak in full sentences and does not appear short of breath  Cardiovascular: No lower extremity edema, non tender, no  erythema  Skin: Warm dry intact with no signs of infection or rash on extremities or on axial skeleton.  Abdomen: Soft nontender  Neuro: Cranial nerves II through XII are intact, neurovascularly intact in all extremities with 2+ DTRs and 2+ pulses.  Lymph: No lymphadenopathy of posterior or anterior cervical chain or axillae bilaterally.  Gait antalgic MSK:  Non tender with full range of motion and good stability and symmetric strength and tone of shoulders, elbows, wrist, hip, and ankles bilaterally.  Knee exam right side shows the patient has severe amount of pain still over the medial aspect of the knee.  Mild pain going down the calf which is a new.  Positive compression test.  Patient's knee otherwise seems to have limited range of motion which is not new.  Limited musculoskeletal ultrasound was performed and interpreted by [name]  Limited ultrasound does not show any type of Baker's cyst at this time.  No pocket of fluid noted at this time.  Some degenerative changes of the medial meniscus but mild.  No significant displacement.  Patient's distal hamstring still has some tendinopathy noted with some mild calcific changes in the area where patient is tender. Impression: No significant findings associated with the Baker's cyst today.    Impression and Recommendations:     This case required medical decision making of moderate complexity. The above documentation has been reviewed and is accurate and complete [name], DO       Note: This dictation was prepared with Dragon dictation along with smaller phrase technology. Any transcriptional errors that result from this process are unintentional.

## 2018-08-22 ENCOUNTER — Encounter: Payer: Self-pay | Admitting: Family Medicine

## 2018-08-22 ENCOUNTER — Ambulatory Visit: Payer: Self-pay

## 2018-08-22 ENCOUNTER — Ambulatory Visit (INDEPENDENT_AMBULATORY_CARE_PROVIDER_SITE_OTHER): Payer: PPO | Admitting: Family Medicine

## 2018-08-22 ENCOUNTER — Ambulatory Visit (INDEPENDENT_AMBULATORY_CARE_PROVIDER_SITE_OTHER)
Admission: RE | Admit: 2018-08-22 | Discharge: 2018-08-22 | Disposition: A | Discharge status: Home / Self Care | Payer: PPO | Source: Ambulatory Visit | Attending: Family Medicine | Admitting: Family Medicine

## 2018-08-22 ENCOUNTER — Ambulatory Visit (HOSPITAL_COMMUNITY)
Admission: RE | Admit: 2018-08-22 | Discharge: 2018-08-22 | Disposition: A | Discharge status: Home / Self Care | Payer: PPO | Source: Ambulatory Visit | Attending: Cardiology | Admitting: Cardiology

## 2018-08-22 VITALS — BP 122/82 | HR 71 | Ht 71.0 in | Wt 203.0 lb

## 2018-08-22 DIAGNOSIS — M25561 Pain in right knee: Secondary | ICD-10-CM | POA: Diagnosis not present

## 2018-08-22 DIAGNOSIS — M79604 Pain in right leg: Secondary | ICD-10-CM | POA: Insufficient documentation

## 2018-08-22 DIAGNOSIS — G8929 Other chronic pain: Secondary | ICD-10-CM

## 2018-08-22 DIAGNOSIS — M7121 Synovial cyst of popliteal space [Baker], right knee: Secondary | ICD-10-CM

## 2018-08-22 NOTE — Patient Instructions (Addendum)
Good to see you  Ice is your friend Stay active  Try a compression sleeve daily  Go downstairs and get an xray today  We will get a ultrasound of the leg today to make sure not a clot.  Also Will get MRI of the knee and call [phone]  Once you know when you are having the MRI then set up an appointment with me 1-2 days after and we will discuss

## 2018-08-22 NOTE — Assessment & Plan Note (Signed)
Patient only had aspiration 3 weeks ago.  I do not see a reaccumulation of the patient's pain is back.  Also decreasing range of motion and instability of the knee make me concerning for a loose body or internal derangement.  MRI ordered today to further evaluate.  Patient is in enough pain that this could be surgical intervention.  Patient is on Plavix but I would like to rule out any type of deep venous thrombosis but due to the pain being so out of proportion.  No signs of any infectious etiology.  Depending on findings we will discuss further with treatment and what options are they are available. Spent  25 minutes with patient face-to-face and had greater than 50% of counseling including as described above in assessment and plan.

## 2018-08-23 ENCOUNTER — Ambulatory Visit
Admission: RE | Admit: 2018-08-23 | Discharge: 2018-08-23 | Disposition: A | Discharge status: Home / Self Care | Payer: PPO | Source: Ambulatory Visit | Attending: Family Medicine | Admitting: Family Medicine

## 2018-08-23 DIAGNOSIS — M25561 Pain in right knee: Secondary | ICD-10-CM | POA: Diagnosis not present

## 2018-08-23 DIAGNOSIS — G8929 Other chronic pain: Secondary | ICD-10-CM

## 2018-08-23 NOTE — Progress Notes (Signed)
[name] [address], [place] [address] Phone: [phone] Subjective:   Jamie [name], am serving as a scribe for Dr. Hulan [name].  I'm seeing this patient by the request  of:    CC: Right knee pain follow-up  [id number]  Jamie [name] is a 62 y.o. male coming in with complaint of right knee pain. Is here for his MRI results. Continued knee pain.  Patient was found to have a degenerative meniscal tear as well as a Baker's cyst that seem to be ruptured.  There was an oblique tear of the posterior horn of the medial meniscus extending to the inferior articular surface.  This was independently visualized by me.     Past Medical History:  Diagnosis Date  . Anxiety   . Arthritis   . Back pain   . CAD (coronary artery disease)   . Depression   . HTN (hypertension)   . Hypercholesteremia    Past Surgical History:  Procedure Laterality Date  . HERNIA REPAIR    . PTCA with stent - [facility]    . VASECTOMY     Social History   Socioeconomic History  . Marital status: Widowed    Spouse name: Not on file  . Number of children: Not on file  . Years of education: Not on file  . Highest education level: Not on file  Occupational History  . Not on file  Social Needs  . Financial resource strain: Not on file  . Food insecurity:    Worry: Not on file    Inability: Not on file  . Transportation needs:    Medical: Not on file    Non-medical: Not on file  Tobacco Use  . Smoking status: Former Research scientist (life sciences)  . Smokeless tobacco: Never Used  Substance and Sexual Activity  . Alcohol use: Yes  . Drug use: [name]  . Sexual activity: Not on file  Lifestyle  . Physical activity:    Days per week: Not on file    Minutes per session: Not on file  . Stress: Not on file  Relationships  . Social connections:    Talks on phone: Not on file    Gets together: Not on file    Attends religious service: Not on file    Active member of club or  organization: Not on file    Attends meetings of clubs or organizations: Not on file    Relationship status: Not on file  Other Topics Concern  . Not on file  Social History Narrative  . Not on file   [name] Known Allergies Family History  Problem Relation Age of Onset  . Heart disease Father   . Heart attack Father   . Hypertension Father   . Stroke Father        X4  . Hypertension Paternal Uncle        X4  . Stroke Paternal Uncle     Current Outpatient Medications (Endocrine & Metabolic):  .  predniSONE (DELTASONE) 50 MG tablet, Take 1 tablet (50 mg total) by mouth daily.  Current Outpatient Medications (Cardiovascular):  .  atorvastatin (LIPITOR) 80 MG tablet, Take 1 tablet (80 mg total) by mouth daily. .  enalapril (VASOTEC) 10 MG tablet, TAKE ONE TABLET BY MOUTH ONE TIME DAILY     Current Outpatient Medications (Hematological):  .  clopidogrel (PLAVIX) 75 MG tablet, Take 1 tablet (75 mg total) by mouth daily.  Current Outpatient Medications (  Other):  .  Cinnamon 500 MG capsule, Take 500 mg by mouth 2 (two) times daily. .  NON FORMULARY, Take 1 capsule by mouth every morning. TUMERIC .  Omega-3 Fatty Acids (FISH OIL PO), Take 1,200 mg by mouth 2 (two) times daily.     Past medical history, social, surgical and family history all reviewed in electronic medical record.  [name] pertanent information unless stated regarding to the chief complaint.   Review of Systems:  [name] headache, visual changes, nausea, vomiting, diarrhea, constipation, dizziness, abdominal pain, skin rash, fevers, chills, night sweats, weight loss, swollen lymph nodes, body aches, joint swelling, muscle aches, chest pain, shortness of breath, mood changes.   Objective  Blood pressure 112/88, pulse 67, height 5\' 11"  (1.803 m), weight 205 lb (93 kg), SpO2 98 %.    General: [name] apparent distress alert and oriented x3 mood and affect normal, dressed appropriately.  HEENT: Pupils equal, extraocular movements  intact  Respiratory: Patient's speak in full sentences and does not appear short of breath  Cardiovascular: [name] lower extremity edema, non tender, [name] erythema  Skin: Warm dry intact with [name] signs of infection or rash on extremities or on axial skeleton.  Abdomen: Soft nontender  Neuro: Cranial nerves II through XII are intact, neurovascularly intact in all extremities with 2+ DTRs and 2+ pulses.  Lymph: [name] lymphadenopathy of posterior or anterior cervical chain or axillae bilaterally.  Gait antalgic gait MSK:  Non tender with full range of motion and good stability and symmetric strength and tone of shoulders, elbows, wrist, hip, and ankles bilaterally.  Knee:right  Normal to inspection with [name] erythema or effusion or obvious bony abnormalities. Palpation normal with [name] warmth, joint line tenderness, patellar tenderness, or condyle tenderness. ROM full in flexion and extension and lower leg rotation. Ligaments with solid consistent endpoints including ACL, PCL, LCL, MCL. Mild Mcmurray's, Apley's, and Thessalonian tests. Non painful patellar compression. Patellar glide without crepitus. Still pain with calf compresison Patellar and quadriceps tendons unremarkable. Hamstring and quadriceps strength is normal.    Impression and Recommendations:     The above documentation has been reviewed and is accurate and complete Jamie Pulley, DO       Note: This dictation was prepared with Dragon dictation along with smaller phrase technology. Any transcriptional errors that result from this process are unintentional.

## 2018-08-24 ENCOUNTER — Ambulatory Visit (INDEPENDENT_AMBULATORY_CARE_PROVIDER_SITE_OTHER): Payer: PPO | Admitting: Family Medicine

## 2018-08-24 ENCOUNTER — Encounter: Payer: Self-pay | Admitting: Family Medicine

## 2018-08-24 DIAGNOSIS — M7121 Synovial cyst of popliteal space [Baker], right knee: Secondary | ICD-10-CM

## 2018-08-24 MED ORDER — PREDNISONE 50 MG PO TABS: 50.0000 mg | ORAL_TABLET | Freq: Every day | ORAL | 0 refills | Status: DC

## 2018-08-24 NOTE — Patient Instructions (Addendum)
Good to see you  Jamie Rice is yoru friend Stay active Knee compression  Prednisone daily for 5 days See me again after thanksgiving and lets see what the cyst looks like and if we can drain it to keep it from rupturing

## 2018-08-24 NOTE — Assessment & Plan Note (Signed)
Baker's cyst with potential rupture.  Patient though is doing relatively well.  Does have a meniscal tear that is likely contributing to some of the swelling.  Prednisone given today, discussed compression and icing regimen.  Topical anti-inflammatories. Patient will follow-up in 3 to 4 weeks and we will re-ultrasound to make sure that possible aspiration is possible.

## 2018-08-31 ENCOUNTER — Telehealth: Payer: Self-pay

## 2018-08-31 NOTE — Telephone Encounter (Signed)
   [facility] Pre-operative Risk Assessment    Request for surgical clearance:  1. What type of surgery is being performed?  colonoscopy   2. When is this surgery scheduled?  11/11/18   3. What type of clearance is required (medical clearance vs. Pharmacy clearance to hold med vs. Both)?  both  4. Are there any medications that need to be held prior to surgery and how long? plavix   5. Practice name and name of physician performing surgery?  [facility]/ Dr Ronnie Doss   6. What is your office phone number [phone]    7.   What is your office fax number [phone]  8.   Anesthesia type (None, local, MAC, general) ?  Gwyneth Sprout  Charlestine Rookstool 08/31/2018, 1:28 PM  _________________________________________________________________   (provider comments below)

## 2018-09-01 ENCOUNTER — Telehealth: Payer: Self-pay | Admitting: Family Medicine

## 2018-09-01 NOTE — Telephone Encounter (Signed)
Patient would like to know if Dr Tamala Julian thinks Flexogenix Knee Treatment would be something he should try or his opinion on it?

## 2018-09-01 NOTE — Telephone Encounter (Signed)
Dr Irish Lack can you comment on holding Plavix for colonoscopy in this patient?  Kerin Ransom PA-C 09/01/2018 4:17 PM

## 2018-09-01 NOTE — Telephone Encounter (Signed)
Spoke with patient about talking to Dr. Tamala Julian about what injection would be best. He is would like to do visco supplementation if possible at next visit.

## 2018-09-01 NOTE — Telephone Encounter (Signed)
OK to hold plavix 5 days before colonoscopy.

## 2018-09-01 NOTE — Telephone Encounter (Signed)
Left message to call back  

## 2018-09-02 NOTE — Telephone Encounter (Signed)
   Primary Cardiologist: Larae Grooms, MD  Chart reviewed as part of pre-operative protocol coverage. Patient was contacted 09/02/2018 in reference to pre-operative risk assessment for pending surgery as outlined below.  Jamie Rice was last seen on 04/06/18 by Dr. Irish Lack.  Since that day, Jamie Rice has done well.  He's had no angina on aggressive medical therapy for CAD. No CHF symptoms. Since his colonoscopy is not until January, I have instructed Jamie Rice to call our office if he develops new symptoms before that time.   Per Dr. Irish Lack it is OK to hold Plavix for 5 days before colonoscopy.  Therefore, based on ACC/AHA guidelines, the patient would be at acceptable risk for the planned procedure without further cardiovascular testing.   I will route this recommendation to the requesting party via Epic fax function and remove from pre-op pool.  Please call with questions.  Daune Perch, NP 09/02/2018, 3:55 PM

## 2018-09-02 NOTE — Telephone Encounter (Signed)
LMFCB. If no new symptoms can clear.

## 2018-09-12 ENCOUNTER — Encounter: Payer: Self-pay | Admitting: Family Medicine

## 2018-09-12 ENCOUNTER — Ambulatory Visit: Payer: PPO | Admitting: Family Medicine

## 2018-09-12 DIAGNOSIS — M7121 Synovial cyst of popliteal space [Baker], right knee: Secondary | ICD-10-CM | POA: Diagnosis not present

## 2018-09-12 MED ORDER — VITAMIN D (ERGOCALCIFEROL) 1.25 MG (50000 UNIT) PO CAPS: 50000.0000 [IU] | ORAL_CAPSULE | ORAL | 0 refills | Status: DC

## 2018-09-12 NOTE — Patient Instructions (Signed)
Good to see you  Ice  Is your friend Once weekly vitamin D for 12 weeks See me again when you need me

## 2018-09-12 NOTE — Assessment & Plan Note (Signed)
Stable overall.  Discussed icing regimen and home exercises.  Discussed which activities to do which was to avoid.  As long as patient does well we will continue to follow-up as needed.

## 2018-09-12 NOTE — Progress Notes (Signed)
[facility] [address] [place], [place] [address] Phone: [phone] Subjective:    I Jamie Rice am serving as a Education administrator for Dr. Hulan Saas.   CC: Knee pain follow-up  [id number]  Jamie Rice is a 62 y.o. male coming in with complaint of knee pain. States the knee is feeling a lot better. Patient was found to have knee pain.  Had found to have a Baker's cyst and some mild arthritic changes.  Has responded well to injections.  Patient states feeling very good.  Minimal discomfort at all.  No swelling noted.  Feeling good overall.     Past Medical History:  Diagnosis Date  . Anxiety   . Arthritis   . Back pain   . CAD (coronary artery disease)   . Depression   . HTN (hypertension)   . Hypercholesteremia    Past Surgical History:  Procedure Laterality Date  . HERNIA REPAIR    . PTCA with stent - [facility]    . VASECTOMY     Social History   Socioeconomic History  . Marital status: Widowed    Spouse name: Not on file  . Number of children: Not on file  . Years of education: Not on file  . Highest education level: Not on file  Occupational History  . Not on file  Social Needs  . Financial resource strain: Not on file  . Food insecurity:    Worry: Not on file    Inability: Not on file  . Transportation needs:    Medical: Not on file    Non-medical: Not on file  Tobacco Use  . Smoking status: Former Research scientist (life sciences)  . Smokeless tobacco: Never Used  Substance and Sexual Activity  . Alcohol use: Yes  . Drug use: No  . Sexual activity: Not on file  Lifestyle  . Physical activity:    Days per week: Not on file    Minutes per session: Not on file  . Stress: Not on file  Relationships  . Social connections:    Talks on phone: Not on file    Gets together: Not on file    Attends religious service: Not on file    Active member of club or organization: Not on file    Attends meetings of clubs or organizations: Not on file   Relationship status: Not on file  Other Topics Concern  . Not on file  Social History Narrative  . Not on file   No Known Allergies Family History  Problem Relation Age of Onset  . Heart disease Father   . Heart attack Father   . Hypertension Father   . Stroke Father        X4  . Hypertension Paternal Uncle        X4  . Stroke Paternal Uncle     Current Outpatient Medications (Endocrine & Metabolic):  .  predniSONE (DELTASONE) 50 MG tablet, Take 1 tablet (50 mg total) by mouth daily.  Current Outpatient Medications (Cardiovascular):  .  atorvastatin (LIPITOR) 80 MG tablet, Take 1 tablet (80 mg total) by mouth daily. .  enalapril (VASOTEC) 10 MG tablet, TAKE ONE TABLET BY MOUTH ONE TIME DAILY     Current Outpatient Medications (Hematological):  .  clopidogrel (PLAVIX) 75 MG tablet, Take 1 tablet (75 mg total) by mouth daily.  Current Outpatient Medications (Other):  .  Cinnamon 500 MG capsule, Take 500 mg by mouth 2 (two) times daily. Marland Kitchen  NON FORMULARY, Take 1 capsule by mouth every morning. TUMERIC .  Omega-3 Fatty Acids (FISH OIL PO), Take 1,200 mg by mouth 2 (two) times daily.  .  Vitamin D, Ergocalciferol, (DRISDOL) 1.25 MG (50000 UT) CAPS capsule, Take 1 capsule (50,000 Units total) by mouth every 7 (seven) days.    Past medical history, social, surgical and family history all reviewed in electronic medical record.  No pertanent information unless stated regarding to the chief complaint.   Review of Systems:  No headache, visual changes, nausea, vomiting, diarrhea, constipation, dizziness, abdominal pain, skin rash, fevers, chills, night sweats, weight loss, swollen lymph nodes, body aches, joint swelling, muscle aches, chest pain, shortness of breath, mood changes.   Objective  Blood pressure (!) 142/80, pulse 81, height 5\' 11"  (1.803 m), weight 206 lb (93.4 kg), SpO2 96 %.    General: No apparent distress alert and oriented x3 mood and affect normal, dressed  appropriately.  HEENT: Pupils equal, extraocular movements intact  Respiratory: Patient's speak in full sentences and does not appear short of breath  Cardiovascular: No lower extremity edema, non tender, no erythema  Skin: Warm dry intact with no signs of infection or rash on extremities or on axial skeleton.  Abdomen: Soft nontender  Neuro: Cranial nerves II through XII are intact, neurovascularly intact in all extremities with 2+ DTRs and 2+ pulses.  Lymph: No lymphadenopathy of posterior or anterior cervical chain or axillae bilaterally.  Gait normal with good balance and coordination.  MSK:  Non tender with full range of motion and good stability and symmetric strength and tone of shoulders, elbows, wrist, hip, and ankles bilaterally.  Right knee exam lacks the last 2 degrees of extension.  Still some mild tenderness over the medial joint line.  No Baker's cyst appreciated.  Neurovascularly intact distally.  Full strength of the lower extremity.   Impression and Recommendations:     . The above documentation has been reviewed and is accurate and complete [name], DO       Note: This dictation was prepared with Dragon dictation along with smaller phrase technology. Any transcriptional errors that result from this process are unintentional.

## 2018-11-11 DIAGNOSIS — K635 Polyp of colon: Secondary | ICD-10-CM | POA: Diagnosis not present

## 2018-11-11 DIAGNOSIS — D123 Benign neoplasm of transverse colon: Secondary | ICD-10-CM | POA: Diagnosis not present

## 2018-11-11 DIAGNOSIS — D125 Benign neoplasm of sigmoid colon: Secondary | ICD-10-CM | POA: Diagnosis not present

## 2018-11-11 DIAGNOSIS — D122 Benign neoplasm of ascending colon: Secondary | ICD-10-CM | POA: Diagnosis not present

## 2018-11-11 DIAGNOSIS — D120 Benign neoplasm of cecum: Secondary | ICD-10-CM | POA: Diagnosis not present

## 2018-11-11 DIAGNOSIS — K644 Residual hemorrhoidal skin tags: Secondary | ICD-10-CM | POA: Diagnosis not present

## 2018-11-11 DIAGNOSIS — K648 Other hemorrhoids: Secondary | ICD-10-CM | POA: Diagnosis not present

## 2018-11-11 DIAGNOSIS — Z1211 Encounter for screening for malignant neoplasm of colon: Secondary | ICD-10-CM | POA: Diagnosis not present

## 2018-11-15 DIAGNOSIS — K635 Polyp of colon: Secondary | ICD-10-CM | POA: Diagnosis not present

## 2018-11-15 DIAGNOSIS — D123 Benign neoplasm of transverse colon: Secondary | ICD-10-CM | POA: Diagnosis not present

## 2018-11-15 DIAGNOSIS — D122 Benign neoplasm of ascending colon: Secondary | ICD-10-CM | POA: Diagnosis not present

## 2018-11-15 DIAGNOSIS — D120 Benign neoplasm of cecum: Secondary | ICD-10-CM | POA: Diagnosis not present

## 2018-11-15 DIAGNOSIS — D125 Benign neoplasm of sigmoid colon: Secondary | ICD-10-CM | POA: Diagnosis not present

## 2019-02-16 ENCOUNTER — Ambulatory Visit: Payer: PPO | Admitting: Family Medicine

## 2019-02-22 ENCOUNTER — Ambulatory Visit: Payer: PPO | Admitting: Family Medicine

## 2019-03-12 NOTE — Progress Notes (Signed)
[name] [address], [place] [address] Phone: [phone] Subjective:   I Jamie Rice am serving as a Education administrator for Dr. Hulan Saas.   CC: right knee pain   [id number]   09/12/18 Stable overall.  Discussed icing regimen and home exercises.  Discussed which activities to do which was to avoid.  As long as patient does well we will continue to follow-up as needed.  03/13/2019 Jamie Rice is a 63 y.o. male coming in with complaint of Right knee pain.  Last time seen patient was in December 2019.  Found to have a Baker's cyst Patient states his knee feels the same as his last visit. Believes he has another bakers cyst.  States that the pain seems to still be on the medial aspect of the knee.  Somewhat posterior.  Has not noticed as much swelling as previously but still feels very similar to what the he had previously.  Reviewed patient's previous MRI that also did show medial arthritic changes.     Past Medical History:  Diagnosis Date  . Anxiety   . Arthritis   . Back pain   . CAD (coronary artery disease)   . Depression   . HTN (hypertension)   . Hypercholesteremia    Past Surgical History:  Procedure Laterality Date  . HERNIA REPAIR    . PTCA with stent - [facility]    . VASECTOMY     Social History   Socioeconomic History  . Marital status: Widowed    Spouse name: Not on file  . Number of children: Not on file  . Years of education: Not on file  . Highest education level: Not on file  Occupational History  . Not on file  Social Needs  . Financial resource strain: Not on file  . Food insecurity:    Worry: Not on file    Inability: Not on file  . Transportation needs:    Medical: Not on file    Non-medical: Not on file  Tobacco Use  . Smoking status: Former Research scientist (life sciences)  . Smokeless tobacco: Never Used  Substance and Sexual Activity  . Alcohol use: Yes  . Drug use: No  . Sexual activity: Not on file  Lifestyle   . Physical activity:    Days per week: Not on file    Minutes per session: Not on file  . Stress: Not on file  Relationships  . Social connections:    Talks on phone: Not on file    Gets together: Not on file    Attends religious service: Not on file    Active member of club or organization: Not on file    Attends meetings of clubs or organizations: Not on file    Relationship status: Not on file  Other Topics Concern  . Not on file  Social History Narrative  . Not on file   No Known Allergies Family History  Problem Relation Age of Onset  . Heart disease Father   . Heart attack Father   . Hypertension Father   . Stroke Father        X4  . Hypertension Paternal Uncle        X4  . Stroke Paternal Uncle     Current Outpatient Medications (Endocrine & Metabolic):  .  predniSONE (DELTASONE) 50 MG tablet, Take 1 tablet (50 mg total) by mouth daily.  Current Outpatient Medications (Cardiovascular):  .  atorvastatin (LIPITOR) 80 MG tablet,  Take 1 tablet (80 mg total) by mouth daily. .  enalapril (VASOTEC) 10 MG tablet, TAKE ONE TABLET BY MOUTH ONE TIME DAILY     Current Outpatient Medications (Hematological):  .  clopidogrel (PLAVIX) 75 MG tablet, Take 1 tablet (75 mg total) by mouth daily.  Current Outpatient Medications (Other):  .  Cinnamon 500 MG capsule, Take 500 mg by mouth 2 (two) times daily. .  NON FORMULARY, Take 1 capsule by mouth every morning. TUMERIC .  Omega-3 Fatty Acids (FISH OIL PO), Take 1,200 mg by mouth 2 (two) times daily.  .  Vitamin D, Ergocalciferol, (DRISDOL) 1.25 MG (50000 UT) CAPS capsule, Take 1 capsule (50,000 Units total) by mouth every 7 (seven) days.    Past medical history, social, surgical and family history all reviewed in electronic medical record.  No pertanent information unless stated regarding to the chief complaint.   Review of Systems:  No headache, visual changes, nausea, vomiting, diarrhea, constipation, dizziness, abdominal  pain, skin rash, fevers, chills, night sweats, weight loss, swollen lymph nodes, body aches, joint swelling, muscle aches, chest pain, shortness of breath, mood changes.   Objective  Blood pressure 120/84, pulse 68, height 5\' 11"  (1.803 m), weight 209 lb (94.8 kg), SpO2 97 %.    General: No apparent distress alert and oriented x3 mood and affect normal, dressed appropriately.  HEENT: Pupils equal, extraocular movements intact  Respiratory: Patient's speak in full sentences and does not appear short of breath  Cardiovascular: No lower extremity edema, non tender, no erythema  Skin: Warm dry intact with no signs of infection or rash on extremities or on axial skeleton.  Abdomen: Soft nontender  Neuro: Cranial nerves II through XII are intact, neurovascularly intact in all extremities with 2+ DTRs and 2+ pulses.  Lymph: No lymphadenopathy of posterior or anterior cervical chain or axillae bilaterally.  Gait normal with good balance and coordination.  MSK:  Non tender with full range of motion and good stability and symmetric strength and tone of shoulders, elbows, wrist, hip, and ankles bilaterally.  Knee: Right knee Mild arthritic changes Palpation n tender to palpation over the medial joint space ROM full in flexion and extension and lower leg rotation. Ligaments with solid consistent endpoints including ACL, PCL, LCL, MCL. Positive Mcmurray's, Apley's, and Thessalonian tests. Non painful patellar compression. Patellar glide with moderate crepitus. Patellar and quadriceps tendons unremarkable. Hamstring and quadriceps strength is normal. Contralateral knee unremarkable  After informed written and verbal consent, patient was seated on exam table. Right knee was prepped with alcohol swab and utilizing anterolateral approach, patient's right knee space was injected with 4:1  marcaine 0.5%: Kenalog 40mg /dL. Patient tolerated the procedure well without immediate complications.   Impression  and Recommendations:     This case required medical decision making of moderate complexity. The above documentation has been reviewed and is accurate and complete Jamie Pulley, DO       Note: This dictation was prepared with Dragon dictation along with smaller phrase technology. Any transcriptional errors that result from this process are unintentional.

## 2019-03-13 ENCOUNTER — Other Ambulatory Visit: Payer: Self-pay

## 2019-03-13 ENCOUNTER — Ambulatory Visit: Payer: Self-pay

## 2019-03-13 ENCOUNTER — Ambulatory Visit (INDEPENDENT_AMBULATORY_CARE_PROVIDER_SITE_OTHER): Payer: PPO | Admitting: Family Medicine

## 2019-03-13 ENCOUNTER — Encounter: Payer: Self-pay | Admitting: Family Medicine

## 2019-03-13 VITALS — BP 120/84 | HR 68 | Ht 71.0 in | Wt 209.0 lb

## 2019-03-13 DIAGNOSIS — M7121 Synovial cyst of popliteal space [Baker], right knee: Secondary | ICD-10-CM | POA: Diagnosis not present

## 2019-03-13 DIAGNOSIS — M25561 Pain in right knee: Secondary | ICD-10-CM

## 2019-03-13 DIAGNOSIS — M1711 Unilateral primary osteoarthritis, right knee: Secondary | ICD-10-CM

## 2019-03-13 DIAGNOSIS — G8929 Other chronic pain: Secondary | ICD-10-CM | POA: Diagnosis not present

## 2019-03-13 NOTE — Patient Instructions (Signed)
Great to see you  Compression sleeve daily could help  Ice 20 minutes 2 times daily. Usually after activity and before bed. Injected the knee and should help  We will get approval for a gel injection just in case See me again in 4 weeks if not a lot better

## 2019-03-13 NOTE — Assessment & Plan Note (Addendum)
Patient given injection.  Tolerated the procedure well.  Discussed icing regimen and home exercise.  Discussed which activities to do which wants to avoid.  Compression bracing but no significant instability so we will hold on custom bracing follow-up again in 4 to 8 weeks could be a candidate for Visco supplementation

## 2019-03-13 NOTE — Assessment & Plan Note (Signed)
Baker's cyst again.  Injected knee at this point.  Patient does have partial thickness tearing of the medial compartment with arthritis.  Could be contributing as well.  Patient will be getting approval for Visco supplementation with him failing all other conservative therapy.  Patient will follow-up with me again in 4 weeks.

## 2019-04-11 ENCOUNTER — Ambulatory Visit: Payer: PPO | Admitting: Family Medicine

## 2019-04-15 ENCOUNTER — Other Ambulatory Visit: Payer: Self-pay | Admitting: Interventional Cardiology

## 2019-05-02 ENCOUNTER — Ambulatory Visit: Payer: PPO | Admitting: Family Medicine

## 2019-05-08 ENCOUNTER — Ambulatory Visit (INDEPENDENT_AMBULATORY_CARE_PROVIDER_SITE_OTHER): Payer: PPO | Admitting: Family Medicine

## 2019-05-08 ENCOUNTER — Encounter: Payer: Self-pay | Admitting: Family Medicine

## 2019-05-08 ENCOUNTER — Other Ambulatory Visit: Payer: Self-pay

## 2019-05-08 VITALS — BP 124/74 | HR 77 | Temp 98.2°F | Resp 16 | Ht 71.0 in | Wt 208.0 lb

## 2019-05-08 DIAGNOSIS — M25561 Pain in right knee: Secondary | ICD-10-CM | POA: Diagnosis not present

## 2019-05-08 DIAGNOSIS — G8929 Other chronic pain: Secondary | ICD-10-CM

## 2019-05-08 DIAGNOSIS — M1711 Unilateral primary osteoarthritis, right knee: Secondary | ICD-10-CM

## 2019-05-08 NOTE — Patient Instructions (Signed)
Good to see you! Call [facility] to schedule MRI  [phone] Will contact you with results.

## 2019-05-08 NOTE — Progress Notes (Signed)
[facility] [address], [place] [address] Phone: [phone] Subjective:    I'm seeing this patient by the request  of:    CC: Right knee pain follow-up  GHW:[id number]  Jamie Rice is a 63 y.o. male coming in with complaint of has seen patient for the right knee pain before.  Pain is been out of proportion again.  Patient has had a Baker's cyst as well as a meniscal tear.  Has responded to injections in the past.  Last injection was 2 months ago and worsening pain again.  States that he can even walk appropriately.  Significant pain even to light palpation over the medial posterior aspect of the knee.  It seems worse than it has ever been before.  Patient did have an MRI in November 2019.  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  Patient states that this sometimes can radiate up to his buttocks.     Past Medical History:  Diagnosis Date  . Anxiety   . Arthritis   . Back pain   . CAD (coronary artery disease)   . Depression   . HTN (hypertension)   . Hypercholesteremia    Past Surgical History:  Procedure Laterality Date  . HERNIA REPAIR    . PTCA with stent - [facility]    . VASECTOMY     Social History   Socioeconomic History  . Marital status: Widowed    Spouse name: Not on file  . Number of children: Not on file  . Years of education: Not on file  . Highest education level: Not on file  Occupational History  . Not on file  Social Needs  . Financial resource strain: Not on file  . Food insecurity    Worry: Not on file    Inability: Not on file  . Transportation needs    Medical: Not on file    Non-medical: Not on file  Tobacco Use  . Smoking status: Former Research scientist (life sciences)  . Smokeless tobacco: Never Used  Substance and Sexual Activity  . Alcohol use: Yes  . Drug use: No  . Sexual activity: Not on file  Lifestyle  . Physical activity    Days per week:  Not on file    Minutes per session: Not on file  . Stress: Not on file  Relationships  . Social Herbalist on phone: Not on file    Gets together: Not on file    Attends religious service: Not on file    Active member of club or organization: Not on file    Attends meetings of clubs or organizations: Not on file    Relationship status: Not on file  Other Topics Concern  . Not on file  Social History Narrative  . Not on file   No Known Allergies Family History  Problem Relation Age of Onset  . Heart disease Father   . Heart attack Father   . Hypertension Father   . Stroke Father        X4  . Hypertension Paternal Uncle        X4  . Stroke Paternal Uncle     Current Outpatient Medications (Endocrine & Metabolic):  .  predniSONE (DELTASONE) 50 MG tablet, Take 1 tablet (50 mg total) by mouth daily.  Current Outpatient Medications (Cardiovascular):  .  atorvastatin (LIPITOR) 80 MG tablet, Take  1 tablet (80 mg total) by mouth daily. .  enalapril (VASOTEC) 10 MG tablet, Take 1 tablet (10 mg total) by mouth daily. Please make annual appt with Dr. Irish Lack for future refills. Thank you. 1st attempt.    Current Outpatient Medications (Hematological):  .  clopidogrel (PLAVIX) 75 MG tablet, Take 1 tablet (75 mg total) by mouth daily.  Current Outpatient Medications (Other):  .  Cinnamon 500 MG capsule, Take 500 mg by mouth 2 (two) times daily. .  NON FORMULARY, Take 1 capsule by mouth every morning. TUMERIC .  Omega-3 Fatty Acids (FISH OIL PO), Take 1,200 mg by mouth 2 (two) times daily.  .  Vitamin D, Ergocalciferol, (DRISDOL) 1.25 MG (50000 UT) CAPS capsule, Take 1 capsule (50,000 Units total) by mouth every 7 (seven) days.    Past medical history, social, surgical and family history all reviewed in electronic medical record.  No pertanent information unless stated regarding to the chief complaint.   Review of Systems:  No headache, visual changes, nausea,  vomiting, diarrhea, constipation, dizziness, abdominal pain, skin rash, fevers, chills, night sweats, weight loss, swollen lymph nodes, body aches, joint swelling, muscle aches, chest pain, shortness of breath, mood changes.   Objective  Blood pressure 124/74, pulse 77, temperature 98.2 F (36.8 C), temperature source Oral, resp. rate 16, height 5\' 11"  (1.803 m), weight 208 lb (94.3 kg), SpO2 96 %.    General: No apparent distress alert and oriented x3 mood and affect normal, dressed appropriately.  HEENT: Pupils equal, extraocular movements intact  Respiratory: Patient's speak in full sentences and does not appear short of breath  Cardiovascular: No lower extremity edema, non tender, no erythema  Skin: Warm dry intact with no signs of infection or rash on extremities or on axial skeleton.  Abdomen: Soft nontender  Neuro: Cranial nerves II through XII are intact, neurovascularly intact in all extremities with 2+ DTRs and 2+ pulses.  Lymph: No lymphadenopathy of posterior or anterior cervical chain or axillae bilaterally.  Gait normal with good balance and coordination.  MSK:  Non tender with full range of motion and good stability and symmetric strength and tone of shoulders, elbows, wrist, hip and ankles bilaterally.  Right knee exam shows the patient does have some trace effusion noted.  Patient severely tender to palpation even to light palpation over the medial joint space especially just proximal to the joint line.  Some in the posterior aspect.  Negative Thompson test.  Patient does have some mild pain with full flexion of the knee.  Patient feels uncomfortable with Thessaly's but not true pain.  No significant instability of the knee.    Impression and Recommendations:     This case required medical decision making of moderate complexity. The above documentation has been reviewed and is accurate and complete [name], DO       Note: This dictation was prepared with Dragon  dictation along with smaller phrase technology. Any transcriptional errors that result from this process are unintentional.

## 2019-05-08 NOTE — Assessment & Plan Note (Signed)
Patient has arthritic changes of the knee.  We have gotten approval for Visco supplementation the patient's pain has some to become more severe.  And in light palpation has given more pain.  No rash noted.  Patient has had a meniscal tear previously.  We discussed that the last MRI was 9 months ago and we can further evaluate.  Discussed with patient that after the imaging we will discuss further medical management and possible surgical intervention will be needed.  Patient did not want to try the viscosupplementation at this time.

## 2019-05-15 ENCOUNTER — Ambulatory Visit: Payer: PPO | Admitting: Family Medicine

## 2019-05-30 ENCOUNTER — Ambulatory Visit
Admission: RE | Admit: 2019-05-30 | Discharge: 2019-05-30 | Disposition: A | Discharge status: Home / Self Care | Payer: PPO | Source: Ambulatory Visit | Attending: Family Medicine | Admitting: Family Medicine

## 2019-05-30 ENCOUNTER — Other Ambulatory Visit: Payer: Self-pay

## 2019-05-30 DIAGNOSIS — S83241A Other tear of medial meniscus, current injury, right knee, initial encounter: Secondary | ICD-10-CM | POA: Diagnosis not present

## 2019-05-30 DIAGNOSIS — G8929 Other chronic pain: Secondary | ICD-10-CM

## 2019-06-06 ENCOUNTER — Other Ambulatory Visit: Payer: Self-pay | Admitting: *Deleted

## 2019-06-06 DIAGNOSIS — M1711 Unilateral primary osteoarthritis, right knee: Secondary | ICD-10-CM

## 2019-06-26 DIAGNOSIS — M7061 Trochanteric bursitis, right hip: Secondary | ICD-10-CM | POA: Diagnosis not present

## 2019-06-26 DIAGNOSIS — M1611 Unilateral primary osteoarthritis, right hip: Secondary | ICD-10-CM | POA: Diagnosis not present

## 2019-06-26 DIAGNOSIS — S83241A Other tear of medial meniscus, current injury, right knee, initial encounter: Secondary | ICD-10-CM | POA: Diagnosis not present

## 2019-07-03 ENCOUNTER — Other Ambulatory Visit: Payer: Self-pay | Admitting: Interventional Cardiology

## 2019-07-03 ENCOUNTER — Telehealth: Payer: Self-pay | Admitting: *Deleted

## 2019-07-03 NOTE — Telephone Encounter (Signed)
   [facility] Pre-operative Risk Assessment    Request for surgical clearance:  1. What type of surgery is being performed? RIGHT KNEE ARTHROSCOPY   2. When is this surgery scheduled? TBD   3. What type of clearance is required (medical clearance vs. Pharmacy clearance to hold med vs. Both)? MEDICAL  4. Are there any medications that need to be held prior to surgery and how long? PLAVIX   5. Practice name and name of physician performing surgery? [facility]; DR. GRAVES   6. What is your office phone number [phone]    7.   What is your office fax number [phone]  8.   Anesthesia type (None, local, MAC, general) ? LOCAL   Julaine Hua 07/03/2019, 4:31 PM  _________________________________________________________________   (provider comments below)

## 2019-07-04 NOTE — Telephone Encounter (Signed)
Called the patient and he is scheduled with Jory Sims, NP on 07/24/19 at 8:45AM. Patient was given address and telephone number to the office. All questions (if any) were answered.

## 2019-07-04 NOTE — Telephone Encounter (Signed)
   [place], MD  Chart reviewed as part of pre-operative protocol coverage. Because of Jamie Rice's past medical history and time since last visit, he/she will require a follow-up visit in order to better assess preoperative cardiovascular risk.  Pre-op covering staff: - Please schedule appointment and call patient to inform them. - Please contact requesting surgeon's office via preferred method (i.e, phone, fax) to inform them of need for appointment prior to surgery.  If applicable, this message will also be routed to pharmacy pool and/or primary cardiologist for input on holding anticoagulant/antiplatelet agent as requested below so that this information is available at time of patient's appointment.   [place], [place]  07/04/2019, 4:19 PM

## 2019-07-24 ENCOUNTER — Ambulatory Visit: Payer: PPO | Admitting: Adult Health

## 2019-08-28 ENCOUNTER — Other Ambulatory Visit: Payer: Self-pay | Admitting: Interventional Cardiology

## 2019-10-01 ENCOUNTER — Other Ambulatory Visit: Payer: Self-pay | Admitting: Interventional Cardiology

## 2019-10-02 ENCOUNTER — Other Ambulatory Visit: Payer: Self-pay | Admitting: Interventional Cardiology

## 2019-10-26 ENCOUNTER — Other Ambulatory Visit: Payer: Self-pay | Admitting: Interventional Cardiology

## 2019-10-27 ENCOUNTER — Telehealth: Payer: Self-pay | Admitting: *Deleted

## 2019-10-27 NOTE — Telephone Encounter (Signed)
   [place], MD  Chart reviewed as part of pre-operative protocol coverage. Because of Jamie Rice's past medical history and time since last visit, he will require a follow-up visit in order to better assess preoperative cardiovascular risk.  Per prior recommendations by Dr. Irish Lack and presumed lack of interval change in medical history, patient can hold plavix 5 days prior to upcoming surgery as long as no anginal symptoms at follow-up.   Pre-op covering staff: - Please schedule appointment and call patient to inform them. - Please contact requesting surgeon's office via preferred method (i.e, phone, fax) to inform them of need for appointment prior to surgery.   Abigail Butts, PA-C  10/27/2019, 11:49 AM

## 2019-10-27 NOTE — Telephone Encounter (Signed)
Pt has appt with Dr. Irish Lack on 11/21/19. I will forward clearance notes to Dr. Irish Lack for pre op clearance. I will always send FYI to surgeon office in regards to appt with cardiologist 11/21/19. Our office will fax clearance over once pt has been cleared. I will remove from the pre op call back pool.

## 2019-10-27 NOTE — Telephone Encounter (Signed)
   [facility] Pre-operative Risk Assessment    Request for surgical clearance:  1. What type of surgery is being performed? RIGHT KNEE ARTHROSCOPY   2. When is this surgery scheduled? 12/06/19   3. What type of clearance is required (medical clearance vs. Pharmacy clearance to hold med vs. Both)? MEDICAL  4. Are there any medications that need to be held prior to surgery and how long? PLAVIX    5. Practice name and name of physician performing surgery? [facility]; DR. Jenny Reichmann LEE GRAVES   6. What is your office phone number [phone]    7.   What is your office fax number [phone]  8.   Anesthesia type (None, local, MAC, general) ?  LOCAL   Julaine Hua 10/27/2019, 11:34 AM  _________________________________________________________________   (provider comments below)

## 2019-11-14 ENCOUNTER — Other Ambulatory Visit: Payer: Self-pay | Admitting: Interventional Cardiology

## 2019-11-14 MED ORDER — ENALAPRIL MALEATE 10 MG PO TABS: 10.0000 mg | ORAL_TABLET | Freq: Every day | ORAL | 0 refills | Status: DC

## 2019-11-19 NOTE — Progress Notes (Signed)
Cardiology Office Note   Date:  11/21/2019   ID:  [name], [name] 1955/12/20, MRN [id number]  PCP:  Aurea Graff.Marlou Sa, MD    No chief complaint on file.  CAD  Wt Readings from Last 3 Encounters:  11/21/19 209 lb 12.8 oz (95.2 kg)  05/08/19 208 lb (94.3 kg)  03/13/19 209 lb (94.8 kg)       History of Present Illness: Jamie Rice is a 64 y.o. 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 He had a bare metal stent placed and did well. He had no chest pain. He felt hot and sweaty. No SHOB. He was taken to [facility] and had an emergency cath.No sx like his MI since that time.    Records from [facility] reviewed. He had a 4.0 x 20 bare-metal stent to the right coronary artery in October 2014. He had inferior and apical hypokinesis. Ejection fraction was preserved.   He had hip replacement surgery in 11/15. He did well with that.   His father had CABG 30+ years ago, and is doing well in his 42s.  In the past, it was noted: "He swims and walks daily, in good weather.  He does not do a lot of weight lifting due to shoulder problems.  He will need left shoulder surgery."  He is postponing this operation.   Since the last visit, he will need an operation for Bakers cyst and torn cartilage.  Outpatient surgery.   He has been off of Plavix but is unsure of how that happened.  Denies : Chest pain. Dizziness. Leg edema. Nitroglycerin use. Orthopnea. Palpitations. Paroxysmal nocturnal dyspnea. Shortness of breath. Syncope.   Exercise is mostly walking.  Swims in the summer.  No trips to the gym due to [place], which has limited.      Past Medical History:  Diagnosis Date  . Anxiety   . Arthritis   . Back pain   . CAD (coronary artery disease)   . Depression   . HTN (hypertension)   . Hypercholesteremia     Past Surgical History:  Procedure Laterality Date  . HERNIA REPAIR    . PTCA with stent - [facility]    . VASECTOMY       Current Outpatient  Medications  Medication Sig Dispense Refill  . atorvastatin (LIPITOR) 80 MG tablet Take 1 tablet (80 mg total) by mouth daily. Pt needs to call and schedule appt with provider for further refills - 2nd attempt 15 tablet 0  . Cinnamon 500 MG capsule Take 500 mg by mouth 2 (two) times daily.    . enalapril (VASOTEC) 10 MG tablet Take 1 tablet (10 mg total) by mouth daily. Please keep upcoming appt in February with Dr. Irish Lack before anymore refills. Thank you 30 tablet 0  . NON FORMULARY Take 1 capsule by mouth every morning. TUMERIC    . Omega-3 Fatty Acids (FISH OIL PO) Take 1,200 mg by mouth 2 (two) times daily.     . predniSONE (DELTASONE) 50 MG tablet Take 1 tablet (50 mg total) by mouth daily. 5 tablet 0  . Vitamin D, Ergocalciferol, (DRISDOL) 1.25 MG (50000 UT) CAPS capsule Take 1 capsule (50,000 Units total) by mouth every 7 (seven) days. 12 capsule 0   No current facility-administered medications for this visit.    Allergies:   Patient has no known allergies.    Social History:  The patient  reports that he has quit smoking. He  has never used smokeless tobacco. He reports current alcohol use. He reports that he does not use drugs.   Family History:  The patient's family history includes Heart attack in his father; Heart disease in his father; Hypertension in his father and paternal uncle; Stroke in his father and paternal uncle.    ROS:  Please see the history of present illness.   Otherwise, review of systems are positive for shoulder pain.   All other systems are reviewed and negative.    PHYSICAL EXAM: VS:  BP (!) 150/80   Pulse 61   Ht 6' (1.829 m)   Wt 209 lb 12.8 oz (95.2 kg)   SpO2 97%   BMI 28.45 kg/m  , BMI Body mass index is 28.45 kg/m. GEN: Well nourished, well developed, in no acute distress  HEENT: normal  Neck: no JVD, carotid bruits, or masses Cardiac: RRR; no murmurs, rubs, or gallops,no edema  Respiratory:  clear to auscultation bilaterally, normal work  of breathing GI: soft, nontender, nondistended, + BS MS: no deformity or atrophy  Skin: warm and dry, no rash Neuro:  Strength and sensation are intact Psych: euthymic mood, full affect   EKG:   The ekg ordered today demonstrates NSR, no ST changes   Recent Labs: No results found for requested labs within last 8760 hours.   Lipid Panel    Component Value Date/Time   CHOL 146 10/03/2014 0734   TRIG 167.0 (H) 10/03/2014 0734   HDL 32.90 (L) 10/03/2014 0734   CHOLHDL 4 10/03/2014 0734   VLDL 33.4 10/03/2014 0734   LDLCALC 80 10/03/2014 0734   LDLDIRECT 123.6 07/04/2014 0829     Other studies Reviewed: Additional studies/ records that were reviewed today with results demonstrating: labs reviewed.   ASSESSMENT AND PLAN:  1. CAD/Old MI: Restart Plavix 75 mg daily.  Continue aggressive secondary prevention.  Whole food, plant-based diet will be helpful. 2. HTN: Readings at home are more controlled than today. At home, 120/70s.  He thinks he ate some extra salt in the last couple of days so his blood pressure is high today. 3. Hyperlipidemia:: Check lipids in the next few weeks.  LDL was 92 in 2019. 4. Will schedule labs  5. Preoperative evaluation: No further cardiac testing needed before knee surgery.  He should hold his Plavix 5 days prior to surgery.   Current medicines are reviewed at length with the patient today.  The patient concerns regarding his medicines were addressed.  The following changes have been made:  No change  Labs/ tests ordered today include:  No orders of the defined types were placed in this encounter.   Recommend 150 minutes/week of aerobic exercise Low fat, low carb, high fiber diet recommended  Disposition:   FU in 1 year   Signed, Larae Grooms, MD  11/21/2019 8:42 AM    [facility] [address], [place], [place]  [address] Phone: [phone]; Fax: [phone]

## 2019-11-21 ENCOUNTER — Ambulatory Visit (INDEPENDENT_AMBULATORY_CARE_PROVIDER_SITE_OTHER): Payer: PPO | Admitting: Interventional Cardiology

## 2019-11-21 ENCOUNTER — Other Ambulatory Visit: Payer: Self-pay

## 2019-11-21 ENCOUNTER — Encounter: Payer: Self-pay | Admitting: Interventional Cardiology

## 2019-11-21 VITALS — BP 150/80 | HR 61 | Ht 72.0 in | Wt 209.8 lb

## 2019-11-21 DIAGNOSIS — I252 Old myocardial infarction: Secondary | ICD-10-CM

## 2019-11-21 DIAGNOSIS — Z01810 Encounter for preprocedural cardiovascular examination: Secondary | ICD-10-CM

## 2019-11-21 DIAGNOSIS — I2510 Atherosclerotic heart disease of native coronary artery without angina pectoris: Secondary | ICD-10-CM

## 2019-11-21 DIAGNOSIS — I10 Essential (primary) hypertension: Secondary | ICD-10-CM

## 2019-11-21 DIAGNOSIS — E782 Mixed hyperlipidemia: Secondary | ICD-10-CM | POA: Diagnosis not present

## 2019-11-21 MED ORDER — CLOPIDOGREL BISULFATE 75 MG PO TABS: 75.0000 mg | ORAL_TABLET | Freq: Every day | ORAL | 3 refills | Status: DC

## 2019-11-21 MED ORDER — ENALAPRIL MALEATE 10 MG PO TABS: 10.0000 mg | ORAL_TABLET | Freq: Every day | ORAL | 3 refills | Status: DC

## 2019-11-21 MED ORDER — ATORVASTATIN CALCIUM 80 MG PO TABS: 80.0000 mg | ORAL_TABLET | Freq: Every day | ORAL | 3 refills | Status: DC

## 2019-11-21 NOTE — Patient Instructions (Signed)
Medication Instructions:  Your physician has recommended you make the following change in your medication:   RESTART: clopidogrel (plavix) 75 mg once a day  You may hold it 5 days prior to your procedure (last dose) 2/19 and then restart right after your procedure  *If you need a refill on your cardiac medications before your next appointment, please call your pharmacy*  Lab Work: Your physician recommends that you return for a FASTING LIPIDS, CMET, CBC, A1C  If you have labs (blood work) drawn today and your tests are completely normal, you will receive your results only by: Marland Kitchen MyChart Message (if you have MyChart) OR . A paper copy in the mail If you have any lab test that is abnormal or we need to change your treatment, we will call you to review the results.  Testing/Procedures: None ordered  Follow-Up: At [facility], you and your health needs are our priority.  As part of our continuing mission to provide you with exceptional heart care, we have created designated Provider Care Teams.  These Care Teams include your primary Cardiologist (physician) and Advanced Practice Providers (APPs -  Physician Assistants and Nurse Practitioners) who all work together to provide you with the care you need, when you need it.  Your next appointment:   12 month(s)  The format for your next appointment:   In Person  Provider:   You may see Larae Grooms, MD or one of the following Advanced Practice Providers on your designated Care Team:    Melina Copa, PA-C  Ermalinda Barrios, PA-C   Other Instructions

## 2019-11-27 ENCOUNTER — Telehealth: Payer: Self-pay | Admitting: *Deleted
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
## 2019-12-06 DIAGNOSIS — M23221 Derangement of posterior horn of medial meniscus due to old tear or injury, right knee: Secondary | ICD-10-CM | POA: Diagnosis not present

## 2019-12-06 DIAGNOSIS — M94261 Chondromalacia, right knee: Secondary | ICD-10-CM | POA: Diagnosis not present

## 2019-12-06 DIAGNOSIS — M6751 Plica syndrome, right knee: Secondary | ICD-10-CM | POA: Diagnosis not present

## 2019-12-14 DIAGNOSIS — Z9889 Other specified postprocedural states: Secondary | ICD-10-CM | POA: Diagnosis not present

## 2019-12-14 DIAGNOSIS — M1611 Unilateral primary osteoarthritis, right hip: Secondary | ICD-10-CM | POA: Diagnosis not present

## 2019-12-14 DIAGNOSIS — R262 Difficulty in walking, not elsewhere classified: Secondary | ICD-10-CM | POA: Diagnosis not present

## 2019-12-14 DIAGNOSIS — M6281 Muscle weakness (generalized): Secondary | ICD-10-CM | POA: Diagnosis not present

## 2019-12-18 DIAGNOSIS — M6281 Muscle weakness (generalized): Secondary | ICD-10-CM | POA: Diagnosis not present

## 2019-12-18 DIAGNOSIS — M1611 Unilateral primary osteoarthritis, right hip: Secondary | ICD-10-CM | POA: Diagnosis not present

## 2019-12-18 DIAGNOSIS — Z9889 Other specified postprocedural states: Secondary | ICD-10-CM | POA: Diagnosis not present

## 2019-12-18 DIAGNOSIS — R262 Difficulty in walking, not elsewhere classified: Secondary | ICD-10-CM | POA: Diagnosis not present

## 2020-01-04 DIAGNOSIS — Z9889 Other specified postprocedural states: Secondary | ICD-10-CM | POA: Diagnosis not present

## 2020-07-06 DIAGNOSIS — Z23 Encounter for immunization: Secondary | ICD-10-CM | POA: Diagnosis not present

## 2020-07-27 ENCOUNTER — Ambulatory Visit: Payer: PPO | Attending: Internal Medicine

## 2020-07-27 DIAGNOSIS — Z23 Encounter for immunization: Secondary | ICD-10-CM

## 2020-07-27 NOTE — Progress Notes (Signed)
   Covid-19 Vaccination Clinic  Name:  TAIJON VINK    MRN: [id number] DOB: Feb 23, 1956  07/27/2020  Mr. Preece was observed post Covid-19 immunization for 15 minutes without incident. He was provided with Vaccine Information Sheet and instruction to access the V-Safe system.   Mr. [place]epinto was instructed to call 911 with any severe reactions post vaccine: Marland Kitchen Difficulty breathing  . Swelling of face and throat  . A fast heartbeat  . A bad rash all over body  . Dizziness and weakness

## 2020-08-29 ENCOUNTER — Encounter: Payer: Self-pay | Admitting: Family Medicine

## 2020-08-29 ENCOUNTER — Other Ambulatory Visit: Payer: Self-pay

## 2020-08-29 ENCOUNTER — Ambulatory Visit (INDEPENDENT_AMBULATORY_CARE_PROVIDER_SITE_OTHER): Payer: PPO | Admitting: Family Medicine

## 2020-08-29 ENCOUNTER — Ambulatory Visit: Payer: Self-pay

## 2020-08-29 VITALS — BP 108/82 | HR 73 | Ht 72.0 in | Wt 209.0 lb

## 2020-08-29 DIAGNOSIS — M25562 Pain in left knee: Secondary | ICD-10-CM | POA: Insufficient documentation

## 2020-08-29 DIAGNOSIS — M7122 Synovial cyst of popliteal space [Baker], left knee: Secondary | ICD-10-CM | POA: Insufficient documentation

## 2020-08-29 DIAGNOSIS — G8929 Other chronic pain: Secondary | ICD-10-CM

## 2020-08-29 DIAGNOSIS — M25561 Pain in right knee: Secondary | ICD-10-CM | POA: Diagnosis not present

## 2020-08-29 NOTE — Patient Instructions (Addendum)
Xray today Brace w activity Drained cyst today See me in 4-6 weeks

## 2020-08-29 NOTE — Assessment & Plan Note (Signed)
Questionable patella subluxation.  Discussed knee bracing, home exercises, x-rays pending.  Did have aspiration of Baker's cyst.  Follow-up again 4 to 8 weeks

## 2020-08-29 NOTE — Assessment & Plan Note (Signed)
Aspiration done today.  Tolerated procedure well.  Patient is on Plavix but no sign of any type of bleeding.  Patient did have improvement in range of motion immediately.  I am concerned that patient does have retropatellar subluxation could cause more of patient's initially.  Discussed icing regimen and home exercise, discussed compression.  Patient will do a Tru pull lite brace.  X-rays pending.  Follow-up again in 4 to 8 weeks.

## 2020-08-29 NOTE — Progress Notes (Signed)
[place] Phone: [phone] Subjective:   Jamie Rice, am serving as a scribe for Dr. Hulan Saas. This visit occurred during the SARS-CoV-2 public health emergency.  Safety protocols were in place, including screening questions prior to the visit, additional usage of staff PPE, and extensive cleaning of exam room while observing appropriate contact time as indicated for disinfecting solutions.   I'm seeing this patient by the request  of:  Jamie Rice, L.Marlou Sa, MD  CC: Left knee pain  [id number]   05/08/2019 Patient has arthritic changes of the knee.  We have gotten approval for Visco supplementation the patient's pain has some to become more severe.  And in light palpation has given more pain.  Rice rash noted.  Patient has had a meniscal tear previously.  We discussed that the last MRI was 9 months ago and we can further evaluate.  Discussed with patient that after the imaging we will discuss further medical management and possible surgical intervention will be needed.  Patient did not want to try the viscosupplementation at this time.   Update 08/29/2020 IHAN PAT is a 64 y.o. male coming in with complaint of left knee pain. Patient states that he had right knee surgery earlier this year.  Doing well on the right side.  Pain in left leg over both sides of the knee. Pain with flexion especially with walking up stairs. Rice injury that he reports. Rice using anything for his pain.  Patient states that just feels like he is unable to move it quite as well.  States that the pain seems to be more on the medial aspect in the posterior aspect of the knee but is on both sides of the knee.     Past Medical History:  Diagnosis Date  . Anxiety   . Arthritis   . Back pain   . CAD (coronary artery disease)   . Depression   . HTN (hypertension)   . Hypercholesteremia    Past Surgical History:  Procedure  Laterality Date  . HERNIA REPAIR    . PTCA with stent - [facility]    . VASECTOMY     Social History   Socioeconomic History  . Marital status: Widowed    Spouse name: Not on file  . Number of children: Not on file  . Years of education: Not on file  . Highest education level: Not on file  Occupational History  . Not on file  Tobacco Use  . Smoking status: Former Research scientist (life sciences)  . Smokeless tobacco: Never Used  Vaping Use  . Vaping Use: Never used  Substance and Sexual Activity  . Alcohol use: Yes  . Drug use: Rice  . Sexual activity: Not on file  Other Topics Concern  . Not on file  Social History Narrative  . Not on file   Social Determinants of Health   Financial Resource Strain:   . Difficulty of Paying Living Expenses: Not on file  Food Insecurity:   . Worried About Charity fundraiser in the Last Year: Not on file  . Ran Out of Food in the Last Year: Not on file  Transportation Needs:   . Lack of Transportation (Medical): Not on file  . Lack of Transportation (Non-Medical): Not on file  Physical Activity:   . Days of Exercise per Week: Not on file  . Minutes of Exercise per Session: Not on file  Stress:   . Feeling  of Stress : Not on file  Social Connections:   . Frequency of Communication with Friends and Family: Not on file  . Frequency of Social Gatherings with Friends and Family: Not on file  . Attends Religious Services: Not on file  . Active Member of Clubs or Organizations: Not on file  . Attends Archivist Meetings: Not on file  . Marital Status: Not on file   Rice Known Allergies Family History  Problem Relation Age of Onset  . Heart disease Father   . Heart attack Father   . Hypertension Father   . Stroke Father        X4  . Hypertension Paternal Uncle        X4  . Stroke Paternal Uncle     Current Outpatient Medications (Endocrine & Metabolic):  .  predniSONE (DELTASONE) 50 MG tablet, Take 1 tablet (50 mg total) by mouth  daily.  Current Outpatient Medications (Cardiovascular):  .  atorvastatin (LIPITOR) 80 MG tablet, Take 1 tablet (80 mg total) by mouth daily. .  enalapril (VASOTEC) 10 MG tablet, Take 1 tablet (10 mg total) by mouth daily.    Current Outpatient Medications (Hematological):  .  clopidogrel (PLAVIX) 75 MG tablet, Take 1 tablet (75 mg total) by mouth daily.  Current Outpatient Medications (Other):  .  Cinnamon 500 MG capsule, Take 500 mg by mouth 2 (two) times daily. .  NON FORMULARY, Take 1 capsule by mouth every morning. TUMERIC .  Omega-3 Fatty Acids (FISH OIL PO), Take 1,200 mg by mouth 2 (two) times daily.  .  Vitamin D, Ergocalciferol, (DRISDOL) 1.25 MG (50000 UT) CAPS capsule, Take 1 capsule (50,000 Units total) by mouth every 7 (seven) days.   Reviewed prior external information including notes and imaging from  primary care provider As well as notes that were available from care everywhere and other healthcare systems.  Past medical history, social, surgical and family history all reviewed in electronic medical record.  Rice pertanent information unless stated regarding to the chief complaint.   Review of Systems:  Rice headache, visual changes, nausea, vomiting, diarrhea, constipation, dizziness, abdominal pain, skin rash, fevers, chills, night sweats, weight loss, swollen lymph nodes, body aches, joint swelling, chest pain, shortness of breath, mood changes. POSITIVE muscle aches  Objective  Blood pressure 108/82, pulse 73, height 6' (1.829 m), weight 209 lb (94.8 kg), SpO2 98 %.   General: Rice apparent distress alert and oriented x3 mood and affect normal, dressed appropriately.  HEENT: Pupils equal, extraocular movements intact  Respiratory: Patient's speak in full sentences and does not appear short of breath  Cardiovascular: Rice lower extremity edema, non tender, Rice erythema  Neuro: Cranial nerves II through XII are intact, neurovascularly intact in all extremities with 2+  DTRs and 2+ pulses.  Gait normal with good balance and coordination.  MSK: Left knee exam shows the patient does have a trace effusion of the patellofemoral.  Mild lateral tracking of the patella noted.  Tender to palpation mildly over the medial joint space and does have fullness noted to the popliteal space.  Patient does have apprehension with movement of the patella with a positive patellar grind test.  Limited musculoskeletal ultrasound was performed and interpreted by [name]  Patient does have mild arthritic changes of the patellofemoral without effusion noted.  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  Appears to be chronic.  Baker's cyst noted.  Procedure: Real-time Ultrasound Guided Injection  of left knee Baker's cyst Device: GE Logiq Q7 Ultrasound guided injection is preferred based studies that show increased duration, increased effect, greater accuracy, decreased procedural pain, increased response rate, and decreased cost with ultrasound guided versus blind injection.  Verbal informed consent obtained.  Time-out conducted.  Noted Rice overlying erythema, induration, or other signs of local infection.  Skin prepped in a sterile fashion.  Local anesthesia: Topical Ethyl chloride.  With sterile technique and under real time ultrasound guidance: With a 22-gauge 2 inch needle patient was injected with 4 cc of 0.5% Marcaine and aspirated 30 cc of straw-colored fluid 1 cc of Kenalog 40 mg/dL. This was from a posterior approach.  Completed without difficulty  Pain immediately improved suggesting accurate placement of the medication.  Advised to call if fevers/chills, erythema, induration, drainage, or persistent bleeding.  Impression: Technically successful ultrasound guided injection.   Impression and Recommendations:     The above documentation has been reviewed and is accurate and complete [name], DO

## 2020-10-17 ENCOUNTER — Encounter: Payer: Self-pay | Admitting: Family Medicine

## 2020-10-17 ENCOUNTER — Other Ambulatory Visit: Payer: Self-pay

## 2020-10-17 ENCOUNTER — Ambulatory Visit: Payer: PPO | Admitting: Family Medicine

## 2020-10-17 ENCOUNTER — Ambulatory Visit: Payer: Self-pay

## 2020-10-17 VITALS — BP 112/82 | HR 65 | Ht 72.0 in | Wt 203.0 lb

## 2020-10-17 DIAGNOSIS — S76311A Strain of muscle, fascia and tendon of the posterior muscle group at thigh level, right thigh, initial encounter: Secondary | ICD-10-CM | POA: Diagnosis not present

## 2020-10-17 DIAGNOSIS — M25561 Pain in right knee: Secondary | ICD-10-CM

## 2020-10-17 DIAGNOSIS — M79661 Pain in right lower leg: Secondary | ICD-10-CM

## 2020-10-17 DIAGNOSIS — M2550 Pain in unspecified joint: Secondary | ICD-10-CM | POA: Diagnosis not present

## 2020-10-17 DIAGNOSIS — G8929 Other chronic pain: Secondary | ICD-10-CM | POA: Diagnosis not present

## 2020-10-17 NOTE — Assessment & Plan Note (Signed)
Patient does have what appears to be more of a hamstring injury again.  We have had something similar to this previously but also has had more of a meniscal injury.  Patient will do a thigh compression, heel lift, topical anti-inflammatories.  We discussed the potential for blood clot even though it is highly unlikely.  We are going to get a D-dimer.  Patient is on the Plavix at this point that does make it lower likelihood.  If D-dimer is normal no other work-up is necessary if positive we will get a Doppler.  Follow-up with me again 4 to 6 weeks

## 2020-10-17 NOTE — Patient Instructions (Signed)
Hamstring exercises Thigh compression Ice 20 min 2x a day Pennsaid 2x a day  Heel lift in right shoe with activity  They will call you for doppler Labs today See me again in 4-6 weeks

## 2020-10-17 NOTE — Progress Notes (Signed)
[facility] [address] [place] [address] Phone: [phone] Subjective:   Bruce Donath, am serving as a scribe for Dr. Antoine Primas. This visit occurred during the SARS-CoV-2 public health emergency.  Safety protocols were in place, including screening questions prior to the visit, additional usage of staff PPE, and extensive cleaning of exam room while observing appropriate contact time as indicated for disinfecting solutions.   I'm seeing this patient by the request  of:  Clovis Riley, L.August Saucer, MD  CC: Knee pain follow-up  [id number]   08/29/2020 Aspiration done today.  Tolerated procedure well.  Patient is on Plavix but no sign of any type of bleeding.  Patient did have improvement in range of motion immediately.  I am concerned that patient does have retropatellar subluxation could cause more of patient's initially.  Discussed icing regimen and home exercise, discussed compression.  Patient will do a Tru pull lite brace.  X-rays pending.  Follow-up again in 4 to 8 weeks.  'Update 10/17/2020 BRIAN ZEITLIN is a 65 y.o. male coming in with complaint of right knee pain. Patient states he started having pain again at this time.  Patient has had this pain previously and did undergo a meniscectomy in 2020.  Reviewed patient's previous MRI in 2020.  Other than a small Baker's cyst fairly unremarkable.  Patient has started having increasing discomfort and pain on the right side again.  States that it seems to radiate up his leg bilaterally.  Patient is on Plavix.  Has not noticed any swelling.  He does feel that his knee is full.   Had aspiration of Baker's cyst of the left knee on August 29, 2020 and since then left knee has not hurt at all  Past Medical History:  Diagnosis Date  . Anxiety   . Arthritis   . Back pain   . CAD (coronary artery disease)   . Depression   . HTN (hypertension)   . Hypercholesteremia    Past Surgical History:   Procedure Laterality Date  . HERNIA REPAIR    . PTCA with stent - [facility]    . VASECTOMY     Social History   Socioeconomic History  . Marital status: Widowed    Spouse name: Not on file  . Number of children: Not on file  . Years of education: Not on file  . Highest education level: Not on file  Occupational History  . Not on file  Tobacco Use  . Smoking status: Former Games developer  . Smokeless tobacco: Never Used  Vaping Use  . Vaping Use: Never used  Substance and Sexual Activity  . Alcohol use: Yes  . Drug use: No  . Sexual activity: Not on file  Other Topics Concern  . Not on file  Social History Narrative  . Not on file   Social Determinants of Health   Financial Resource Strain: Not on file  Food Insecurity: Not on file  Transportation Needs: Not on file  Physical Activity: Not on file  Stress: Not on file  Social Connections: Not on file   No Known Allergies Family History  Problem Relation Age of Onset  . Heart disease Father   . Heart attack Father   . Hypertension Father   . Stroke Father        X4  . Hypertension Paternal Uncle        X4  . Stroke Paternal Uncle     Current Outpatient Medications (Endocrine &  Metabolic):  .  predniSONE (DELTASONE) 50 MG tablet, Take 1 tablet (50 mg total) by mouth daily.  Current Outpatient Medications (Cardiovascular):  .  atorvastatin (LIPITOR) 80 MG tablet, Take 1 tablet (80 mg total) by mouth daily. .  enalapril (VASOTEC) 10 MG tablet, Take 1 tablet (10 mg total) by mouth daily.    Current Outpatient Medications (Hematological):  .  clopidogrel (PLAVIX) 75 MG tablet, Take 1 tablet (75 mg total) by mouth daily.  Current Outpatient Medications (Other):  .  Cinnamon 500 MG capsule, Take 500 mg by mouth 2 (two) times daily. .  NON FORMULARY, Take 1 capsule by mouth every morning. TUMERIC .  Omega-3 Fatty Acids (FISH OIL PO), Take 1,200 mg by mouth 2 (two) times daily.  .  Vitamin D, Ergocalciferol,  (DRISDOL) 1.25 MG (50000 UT) CAPS capsule, Take 1 capsule (50,000 Units total) by mouth every 7 (seven) days.   Reviewed prior external information including notes and imaging from  primary care provider As well as notes that were available from care everywhere and other healthcare systems.  Past medical history, social, surgical and family history all reviewed in electronic medical record.  No pertanent information unless stated regarding to the chief complaint.   Review of Systems:  No headache, visual changes, nausea, vomiting, diarrhea, constipation, dizziness, abdominal pain, skin rash, fevers, chills, night sweats, weight loss, swollen lymph nodes, body aches, joint swelling, chest pain, shortness of breath, mood changes. POSITIVE muscle aches  Objective  Blood pressure 112/82, pulse 65, height 6' (1.829 m), weight 203 lb (92.1 kg), SpO2 97 %.   General: No apparent distress alert and oriented x3 mood and affect normal, dressed appropriately.  HEENT: Pupils equal, extraocular movements intact  Respiratory: Patient's speak in full sentences and does not appear short of breath  Cardiovascular: No lower extremity edema, non tender, no erythema  Gait normal with good balance and coordination.  MSK: Right knee exam shows no significant abnormality noted on exam.  Mild tenderness over the medial joint line but severe tenderness narrowing of the posterior aspect of the knee and more proximal.  Mild discomfort of the calf tightness squeeze.  Near full range of motion of the knee.  Mild positive McMurray's. Neurovascularly intact distally.  Pain to palpation on the posterior aspect of the knee is seems to be a little out of proportion.  Limited musculoskeletal ultrasound was performed and interpreted by [name]  Limited ultrasound shows no very specific.  Postsurgical changes of the medial compartment for that meniscectomy.  Patient does have hypoechoic changes around the tendon noted.   Consistent with potentially mild tendinitis.  Questionable Baker's cyst but extremely small noted. Impression: Questionable hamstring tendinitis    Impression and Recommendations:     The above documentation has been reviewed and is accurate and complete [name], DO

## 2020-10-18 LAB — D-DIMER, QUANTITATIVE: D-Dimer, Quant: 0.31 mcg/mL FEU (ref <0.50)

## 2020-10-21 DIAGNOSIS — D2239 Melanocytic nevi of other parts of face: Secondary | ICD-10-CM | POA: Diagnosis not present

## 2020-10-21 DIAGNOSIS — L720 Epidermal cyst: Secondary | ICD-10-CM | POA: Diagnosis not present

## 2020-10-21 DIAGNOSIS — D485 Neoplasm of uncertain behavior of skin: Secondary | ICD-10-CM | POA: Diagnosis not present

## 2020-10-21 DIAGNOSIS — L723 Sebaceous cyst: Secondary | ICD-10-CM | POA: Diagnosis not present

## 2020-10-23 ENCOUNTER — Encounter (HOSPITAL_COMMUNITY): Payer: PPO

## 2020-11-04 DIAGNOSIS — L723 Sebaceous cyst: Secondary | ICD-10-CM | POA: Diagnosis not present

## 2020-11-20 DIAGNOSIS — L720 Epidermal cyst: Secondary | ICD-10-CM | POA: Diagnosis not present

## 2020-11-20 NOTE — Progress Notes (Deleted)
[place] [address] [place] Phone: [phone] Subjective:    I'm seeing this patient by the request  of:  Alroy Dust, L.Marlou Sa, MD  CC: right knee pain   [id number]  Jamie Rice is a 65 y.o. male coming in with complaint of ***  Onset-  Location Duration-  Character- Aggravating factors- Reliving factors-  Therapies tried-  Severity-     Past Medical History:  Diagnosis Date  . Anxiety   . Arthritis   . Back pain   . CAD (coronary artery disease)   . Depression   . HTN (hypertension)   . Hypercholesteremia    Past Surgical History:  Procedure Laterality Date  . HERNIA REPAIR    . PTCA with stent - [facility]    . VASECTOMY     Social History   Socioeconomic History  . Marital status: Widowed    Spouse name: Not on file  . Number of children: Not on file  . Years of education: Not on file  . Highest education level: Not on file  Occupational History  . Not on file  Tobacco Use  . Smoking status: Former Research scientist (life sciences)  . Smokeless tobacco: Never Used  Vaping Use  . Vaping Use: Never used  Substance and Sexual Activity  . Alcohol use: Yes  . Drug use: No  . Sexual activity: Not on file  Other Topics Concern  . Not on file  Social History Narrative  . Not on file   Social Determinants of Health   Financial Resource Strain: Not on file  Food Insecurity: Not on file  Transportation Needs: Not on file  Physical Activity: Not on file  Stress: Not on file  Social Connections: Not on file   No Known Allergies Family History  Problem Relation Age of Onset  . Heart disease Father   . Heart attack Father   . Hypertension Father   . Stroke Father        X4  . Hypertension Paternal Uncle        X4  . Stroke Paternal Uncle     Current Outpatient Medications (Endocrine & Metabolic):  .  predniSONE (DELTASONE) 50 MG tablet, Take 1 tablet (50 mg total) by mouth daily.  Current Outpatient  Medications (Cardiovascular):  .  atorvastatin (LIPITOR) 80 MG tablet, Take 1 tablet (80 mg total) by mouth daily. .  enalapril (VASOTEC) 10 MG tablet, Take 1 tablet (10 mg total) by mouth daily.    Current Outpatient Medications (Hematological):  .  clopidogrel (PLAVIX) 75 MG tablet, Take 1 tablet (75 mg total) by mouth daily.  Current Outpatient Medications (Other):  .  Cinnamon 500 MG capsule, Take 500 mg by mouth 2 (two) times daily. .  NON FORMULARY, Take 1 capsule by mouth every morning. TUMERIC .  Omega-3 Fatty Acids (FISH OIL PO), Take 1,200 mg by mouth 2 (two) times daily.  .  Vitamin D, Ergocalciferol, (DRISDOL) 1.25 MG (50000 UT) CAPS capsule, Take 1 capsule (50,000 Units total) by mouth every 7 (seven) days.   Reviewed prior external information including notes and imaging from  primary care provider As well as notes that were available from care everywhere and other healthcare systems.  Past medical history, social, surgical and family history all reviewed in electronic medical record.  No pertanent information unless stated regarding to the chief complaint.   Review of Systems:  No headache, visual changes, nausea, vomiting, diarrhea, constipation, dizziness, abdominal pain,  skin rash, fevers, chills, night sweats, weight loss, swollen lymph nodes, body aches, joint swelling, chest pain, shortness of breath, mood changes. POSITIVE muscle aches  Objective  There were no vitals taken for this visit.   General: No apparent distress alert and oriented x3 mood and affect normal, dressed appropriately.  HEENT: Pupils equal, extraocular movements intact  Respiratory: Patient's speak in full sentences and does not appear short of breath  Cardiovascular: No lower extremity edema, non tender, no erythema  Gait normal with good balance and coordination.  MSK:   Right knee shows  Ltd msk US performed and interpreted by [name]  Limited ultrasound shows     Impression  and Recommendations:     The above documentation has been reviewed and is accurate and complete [name], DO

## 2020-11-21 ENCOUNTER — Ambulatory Visit: Payer: PPO | Admitting: Family Medicine

## 2020-11-25 DIAGNOSIS — L02213 Cutaneous abscess of chest wall: Secondary | ICD-10-CM | POA: Diagnosis not present

## 2020-11-25 DIAGNOSIS — L720 Epidermal cyst: Secondary | ICD-10-CM | POA: Diagnosis not present

## 2020-11-27 NOTE — Progress Notes (Deleted)
Cardiology Office Note   Date:  11/27/2020   ID:  [name], Jamie Rice, 1957, MRN [id number]  PCP:  Jamie Rice, Jamie Jews.Marlou Sa, MD    No chief complaint on file.  CAD  Wt Readings from Last 3 Encounters:  10/17/20 203 lb (92.1 kg)  08/29/20 209 lb (94.8 kg)  11/21/19 209 lb 12.8 oz (95.2 kg)       History of Present Illness: Jamie Rice is a 65 y.o. 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 He had a bare metal stent placed and did well. He had no chest pain. He felt hot and sweaty. No SHOB. He was taken to [facility] and had an emergency cath.No sx like his MI since that time.    Records from [facility] reviewed. He had a 4.0 x 20 bare-metal stent to the right coronary artery in October 2014. He had inferior and apical hypokinesis. Ejection fraction was preserved.   He had hip replacement surgery in 11/15. He did well with that.  His father had CABG 30+ years ago, and is doing well in his 49s.  In the past, it was noted: "He swims and walks daily, in good weather. He does not do a lot of weight lifting due to shoulder problems. He will need left shoulder surgery." He is postponing this operation.   Since the last visit, he will need an operation for Bakers cyst and torn cartilage.  Outpatient surgery.      Past Medical History:  Diagnosis Date  . Anxiety   . Arthritis   . Back pain   . CAD (coronary artery disease)   . Depression   . HTN (hypertension)   . Hypercholesteremia     Past Surgical History:  Procedure Laterality Date  . HERNIA REPAIR    . PTCA with stent - [facility]    . VASECTOMY       Current Outpatient Medications  Medication Sig Dispense Refill  . atorvastatin (LIPITOR) 80 MG tablet Take 1 tablet (80 mg total) by mouth daily. 90 tablet 3  . Cinnamon 500 MG capsule Take 500 mg by mouth 2 (two) times daily.    . clopidogrel (PLAVIX) 75 MG tablet Take 1 tablet (75 mg total) by mouth daily. 90 tablet 3  . enalapril  (VASOTEC) 10 MG tablet Take 1 tablet (10 mg total) by mouth daily. 90 tablet 3  . NON FORMULARY Take 1 capsule by mouth every morning. TUMERIC    . Omega-3 Fatty Acids (FISH OIL PO) Take 1,200 mg by mouth 2 (two) times daily.     . predniSONE (DELTASONE) 50 MG tablet Take 1 tablet (50 mg total) by mouth daily. 5 tablet 0  . Vitamin D, Ergocalciferol, (DRISDOL) 1.25 MG (50000 UT) CAPS capsule Take 1 capsule (50,000 Units total) by mouth every 7 (seven) days. 12 capsule 0   No current facility-administered medications for this visit.    Allergies:   Patient has no known allergies.    Social History:  The patient  reports that he has quit smoking. He has never used smokeless tobacco. He reports current alcohol use. He reports that he does not use drugs.   Family History:  The patient's ***family history includes Heart attack in his father; Heart disease in his father; Hypertension in his father and paternal uncle; Stroke in his father and paternal uncle.    ROS:  Please see the history of present illness.   Otherwise, review of systems are  positive for ***.   All other systems are reviewed and negative.    PHYSICAL EXAM: VS:  There were no vitals taken for this visit. , BMI There is no height or weight on file to calculate BMI. GEN: Well nourished, well developed, in no acute distress HEENT: normal Neck: no JVD, carotid bruits, or masses Cardiac: ***RRR; no murmurs, rubs, or gallops,no edema  Respiratory:  clear to auscultation bilaterally, normal work of breathing GI: soft, nontender, nondistended, + BS MS: no deformity or atrophy Skin: warm and dry, no rash Neuro:  Strength and sensation are intact Psych: euthymic mood, full affect   EKG:   The ekg ordered today demonstrates ***   Recent Labs: No results found for requested labs within last 8760 hours.   Lipid Panel    Component Value Date/Time   CHOL 146 10/03/2014 0734   TRIG 167.0 (H) 10/03/2014 0734   HDL 32.90 (L)  10/03/2014 0734   CHOLHDL 4 10/03/2014 0734   VLDL 33.4 10/03/2014 0734   LDLCALC 80 10/03/2014 0734   LDLDIRECT 123.6 07/04/2014 0829     Other studies Reviewed: Additional studies/ records that were reviewed today with results demonstrating: ***.   ASSESSMENT AND PLAN:  1. CAD/Old MI:  2. HTN: 3. Hyperlipdiemia:    Current medicines are reviewed at length with the patient today.  The patient concerns regarding his medicines were addressed.  The following changes have been made:  No change***  Labs/ tests ordered today include: *** No orders of the defined types were placed in this encounter.   Recommend 150 minutes/week of aerobic exercise Low fat, low carb, high fiber diet recommended  Disposition:   FU in ***   Signed, Jamie Grooms, MD  11/27/2020 12:57 PM    [facility] [address], [place], [place]  [address] Phone: [phone]; Fax: [phone]

## 2020-11-28 ENCOUNTER — Ambulatory Visit: Payer: PPO | Admitting: Interventional Cardiology

## 2020-11-28 DIAGNOSIS — E782 Mixed hyperlipidemia: Secondary | ICD-10-CM

## 2020-11-28 DIAGNOSIS — I252 Old myocardial infarction: Secondary | ICD-10-CM

## 2020-11-28 DIAGNOSIS — I10 Essential (primary) hypertension: Secondary | ICD-10-CM

## 2020-11-28 DIAGNOSIS — I2510 Atherosclerotic heart disease of native coronary artery without angina pectoris: Secondary | ICD-10-CM

## 2020-12-09 DIAGNOSIS — B999 Unspecified infectious disease: Secondary | ICD-10-CM | POA: Diagnosis not present

## 2020-12-09 DIAGNOSIS — L02213 Cutaneous abscess of chest wall: Secondary | ICD-10-CM | POA: Diagnosis not present

## 2020-12-09 DIAGNOSIS — N611 Abscess of the breast and nipple: Secondary | ICD-10-CM | POA: Diagnosis not present

## 2020-12-11 NOTE — Progress Notes (Deleted)
[place] [address] [place] Phone: [phone] Subjective:    I'm seeing this patient by the request  of:  Alroy Dust, L.Marlou Sa, MD  CC:   [id number]   10/17/2020 Patient does have what appears to be more of a hamstring injury again.  We have had something similar to this previously but also has had more of a meniscal injury.  Patient will do a thigh compression, heel lift, topical anti-inflammatories.  We discussed the potential for blood clot even though it is highly unlikely.  We are going to get a D-dimer.  Patient is on the Plavix at this point that does make it lower likelihood.  If D-dimer is normal no other work-up is necessary if positive we will get a Doppler.  Follow-up with me again 4 to 6 weeks   Update 12/12/2020 AHLIJAH RAIA is a 65 y.o. male coming in with complaint of right knee and hamstring pain. Patient states   Onset-  Location Duration-  Character- Aggravating factors- Reliving factors-  Therapies tried-  Severity-     Past Medical History:  Diagnosis Date  . Anxiety   . Arthritis   . Back pain   . CAD (coronary artery disease)   . Depression   . HTN (hypertension)   . Hypercholesteremia    Past Surgical History:  Procedure Laterality Date  . HERNIA REPAIR    . PTCA with stent - [facility]    . VASECTOMY     Social History   Socioeconomic History  . Marital status: Widowed    Spouse name: Not on file  . Number of children: Not on file  . Years of education: Not on file  . Highest education level: Not on file  Occupational History  . Not on file  Tobacco Use  . Smoking status: Former Research scientist (life sciences)  . Smokeless tobacco: Never Used  Vaping Use  . Vaping Use: Never used  Substance and Sexual Activity  . Alcohol use: Yes  . Drug use: No  . Sexual activity: Not on file  Other Topics Concern  . Not on file  Social History Narrative  . Not on file   Social Determinants of Health    Financial Resource Strain: Not on file  Food Insecurity: Not on file  Transportation Needs: Not on file  Physical Activity: Not on file  Stress: Not on file  Social Connections: Not on file   No Known Allergies Family History  Problem Relation Age of Onset  . Heart disease Father   . Heart attack Father   . Hypertension Father   . Stroke Father        X4  . Hypertension Paternal Uncle        X4  . Stroke Paternal Uncle     Current Outpatient Medications (Endocrine & Metabolic):  .  predniSONE (DELTASONE) 50 MG tablet, Take 1 tablet (50 mg total) by mouth daily.  Current Outpatient Medications (Cardiovascular):  .  atorvastatin (LIPITOR) 80 MG tablet, Take 1 tablet (80 mg total) by mouth daily. .  enalapril (VASOTEC) 10 MG tablet, Take 1 tablet (10 mg total) by mouth daily.    Current Outpatient Medications (Hematological):  .  clopidogrel (PLAVIX) 75 MG tablet, Take 1 tablet (75 mg total) by mouth daily.  Current Outpatient Medications (Other):  .  Cinnamon 500 MG capsule, Take 500 mg by mouth 2 (two) times daily. .  NON FORMULARY, Take 1 capsule by mouth every morning.  TUMERIC .  Omega-3 Fatty Acids (FISH OIL PO), Take 1,200 mg by mouth 2 (two) times daily.  .  Vitamin D, Ergocalciferol, (DRISDOL) 1.25 MG (50000 UT) CAPS capsule, Take 1 capsule (50,000 Units total) by mouth every 7 (seven) days.   Reviewed prior external information including notes and imaging from  primary care provider As well as notes that were available from care everywhere and other healthcare systems.  Past medical history, social, surgical and family history all reviewed in electronic medical record.  No pertanent information unless stated regarding to the chief complaint.   Review of Systems:  No headache, visual changes, nausea, vomiting, diarrhea, constipation, dizziness, abdominal pain, skin rash, fevers, chills, night sweats, weight loss, swollen lymph nodes, body aches, joint swelling,  chest pain, shortness of breath, mood changes. POSITIVE muscle aches  Objective  There were no vitals taken for this visit.   General: No apparent distress alert and oriented x3 mood and affect normal, dressed appropriately.  HEENT: Pupils equal, extraocular movements intact  Respiratory: Patient's speak in full sentences and does not appear short of breath  Cardiovascular: No lower extremity edema, non tender, no erythema  Gait normal with good balance and coordination.  MSK:  Non tender with full range of motion and good stability and symmetric strength and tone of shoulders, elbows, wrist, hip, knee and ankles bilaterally.     Impression and Recommendations:     The above documentation has been reviewed and is accurate and complete [name], DO

## 2020-12-12 ENCOUNTER — Ambulatory Visit: Payer: PPO | Admitting: Family Medicine

## 2021-01-08 DIAGNOSIS — L720 Epidermal cyst: Secondary | ICD-10-CM | POA: Diagnosis not present

## 2021-01-08 DIAGNOSIS — L723 Sebaceous cyst: Secondary | ICD-10-CM | POA: Diagnosis not present

## 2021-01-09 ENCOUNTER — Other Ambulatory Visit: Payer: Self-pay

## 2021-01-09 ENCOUNTER — Encounter: Payer: Self-pay | Admitting: Interventional Cardiology

## 2021-01-09 ENCOUNTER — Ambulatory Visit: Payer: PPO | Admitting: Interventional Cardiology

## 2021-01-09 VITALS — BP 150/84 | HR 65 | Ht 72.0 in | Wt 203.6 lb

## 2021-01-09 DIAGNOSIS — I2510 Atherosclerotic heart disease of native coronary artery without angina pectoris: Secondary | ICD-10-CM | POA: Diagnosis not present

## 2021-01-09 DIAGNOSIS — I252 Old myocardial infarction: Secondary | ICD-10-CM | POA: Diagnosis not present

## 2021-01-09 DIAGNOSIS — I10 Essential (primary) hypertension: Secondary | ICD-10-CM | POA: Diagnosis not present

## 2021-01-09 DIAGNOSIS — E782 Mixed hyperlipidemia: Secondary | ICD-10-CM

## 2021-01-09 DIAGNOSIS — Z87891 Personal history of nicotine dependence: Secondary | ICD-10-CM

## 2021-01-09 NOTE — Progress Notes (Signed)
Cardiology Office Note   Date:  01/09/2021   ID:  Jamie Rice, Jamie Rice 1956-06-08, MRN [id number]  PCP:  Alroy Dust, Carlean Jews.Marlou Sa, MD    No chief complaint on file.  CAD/Old MI  Wt Readings from Last 3 Encounters:  01/09/21 203 lb 9.6 oz (92.4 kg)  10/17/20 203 lb (92.1 kg)  08/29/20 209 lb (94.8 kg)       History of Present Illness: Jamie Rice is a 65 y.o. 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 He had a bare metal stent placed and did well. He had no chest pain. He felt hot and sweaty. No SHOB. He was taken to [facility] and had an emergency cath.No sx like his MI since that time.    Records from [facility] reviewed. He had a 4.0 x 20 bare-metal stent to the right coronary artery in October 2014. He had inferior and apical hypokinesis. Ejection fraction was preserved.   He had hip replacement surgery in 11/15. He did well with that.  His father had CABG 30+ years ago, and is doing well in his 33s.  In the past, it was noted: "He swims and walks daily, in good weather. He does not do a lot of weight lifting due to shoulder problems. He will need left shoulder surgery." He is postponing this operation.   He had an operation for Bakers cyst and torn cartilage.  Outpatient surgery.   Back on Plavix , has some nuisance bleeding.  He does take some supplements for arthritis.     Past Medical History:  Diagnosis Date  . Anxiety   . Arthritis   . Back pain   . CAD (coronary artery disease)   . Depression   . HTN (hypertension)   . Hypercholesteremia     Past Surgical History:  Procedure Laterality Date  . HERNIA REPAIR    . PTCA with stent - [facility]    . VASECTOMY       Current Outpatient Medications  Medication Sig Dispense Refill  . atorvastatin (LIPITOR) 80 MG tablet Take 1 tablet (80 mg total) by mouth daily. 90 tablet 3  . Cinnamon 500 MG capsule Take 500 mg by mouth 2 (two) times daily.    . clopidogrel (PLAVIX) 75 MG tablet  Take 1 tablet (75 mg total) by mouth daily. 90 tablet 3  . enalapril (VASOTEC) 10 MG tablet Take 1 tablet (10 mg total) by mouth daily. 90 tablet 3  . NON FORMULARY Take 1 capsule by mouth every morning. TUMERIC    . Omega-3 Fatty Acids (FISH OIL PO) Take 1,200 mg by mouth 2 (two) times daily.     . Vitamin D, Ergocalciferol, (DRISDOL) 1.25 MG (50000 UT) CAPS capsule Take 1 capsule (50,000 Units total) by mouth every 7 (seven) days. 12 capsule 0   No current facility-administered medications for this visit.    Allergies:   Patient has no known allergies.    Social History:  The patient  reports that he has quit smoking. He has never used smokeless tobacco. He reports current alcohol use. He reports that he does not use drugs.   Family History:  The patient's family history includes Heart attack in his father; Heart disease in his father; Hypertension in his father and paternal uncle; Stroke in his father and paternal uncle.    ROS:  Please see the history of present illness.   Otherwise, review of systems are positive for mild left knee pain.  All other systems are reviewed and negative.    PHYSICAL EXAM: VS:  BP (!) 150/84   Pulse 65   Ht 6' (1.829 m)   Wt 203 lb 9.6 oz (92.4 kg)   SpO2 94%   BMI 27.61 kg/m  , BMI Body mass index is 27.61 kg/m. GEN: Well nourished, well developed, in no acute distress  HEENT: normal  Neck: no JVD, carotid bruits, or masses Cardiac: RRR; no murmurs, rubs, or gallops,no edema  Respiratory:  clear to auscultation bilaterally, normal work of breathing GI: soft, nontender, nondistended, + BS MS: no deformity or atrophy  Skin: warm and dry, no rash Neuro:  Strength and sensation are intact Psych: euthymic mood, full affect   EKG:   The ekg ordered today demonstrates NSR, no ST changes   Recent Labs: No results found for requested labs within last 8760 hours.   Lipid Panel    Component Value Date/Time   CHOL 146 10/03/2014 0734   TRIG  167.0 (H) 10/03/2014 0734   HDL 32.90 (L) 10/03/2014 0734   CHOLHDL 4 10/03/2014 0734   VLDL 33.4 10/03/2014 0734   LDLCALC 80 10/03/2014 0734   LDLDIRECT 123.6 07/04/2014 0829     Other studies Reviewed: Additional studies/ records that were reviewed today with results demonstrating: old labs reviewed.   ASSESSMENT AND PLAN:  1. CAD/Old MI: No angina.  No sx like prior MI.  More active since his knee surgery.  2. HTN: At other MDs, BP is well controlled. Did not take meds this AM. The current medical regimen is effective;  continue present plan and medications. 3. Hyperlipidemia: LDL 92 in 2019.  Continue atorvastatin 80 mg daily. To be rechecked soon.  He has improved his diet.   Target LDL < 70.   4. DM: A1C 7.8 in 2019.  He is to see Dr. Alroy Dust in a few weeks.   5. Needs a screening u/s for AAA in 6/22.    Current medicines are reviewed at length with the patient today.  The patient concerns regarding his medicines were addressed.  The following changes have been made:  No change  Labs/ tests ordered today include:   Orders Placed This Encounter  Procedures  . EKG 12-Lead  . VAS [place] AAA DUPLEX    Recommend 150 minutes/week of aerobic exercise Low fat, low carb, high fiber diet recommended  Disposition:   FU in 1 year   Signed, Larae Grooms, MD  01/09/2021 9:58 AM    [facility] [place], [place],   [address] Phone: [phone]; Fax: [phone]

## 2021-01-09 NOTE — Patient Instructions (Addendum)
Medication Instructions:  Your physician recommends that you continue on your current medications as directed. Please refer to the Current Medication list given to you today.  *If you need a refill on your cardiac medications before your next appointment, please call your pharmacy*   Lab Work: none If you have labs (blood work) drawn today and your tests are completely normal, you will receive your results only by: Marland Kitchen MyChart Message (if you have MyChart) OR . A paper copy in the mail If you have any lab test that is abnormal or we need to change your treatment, we will call you to review the results.   Testing/Procedures: Your physician has requested that you have an abdominal aorta duplex. During this test, an ultrasound is used to evaluate the aorta. Allow 30 minutes for this exam. Do not eat after midnight the day before and avoid carbonated beverages To be done after May 18,2022    Follow-Up: At [facility], you and your health needs are our priority.  As part of our continuing mission to provide you with exceptional heart care, we have created designated Provider Care Teams.  These Care Teams include your primary Cardiologist (physician) and Advanced Practice Providers (APPs -  Physician Assistants and Nurse Practitioners) who all work together to provide you with the care you need, when you need it.  We recommend signing up for the patient portal called "MyChart".  Sign up information is provided on this After Visit Summary.  MyChart is used to connect with patients for Virtual Visits (Telemedicine).  Patients are able to view lab/test results, encounter notes, upcoming appointments, etc.  Non-urgent messages can be sent to your provider as well.   To learn more about what you can do with MyChart, go to [email or web address].    Your next appointment:   12 month(s)  The format for your next appointment:   In Person  Provider:   You may see Larae Grooms, MD or one of  the following Advanced Practice Providers on your designated Care Team:    Melina Copa, PA-C  Ermalinda Barrios, PA-C    Other Instructions   High-Fiber Eating Plan Fiber, also called dietary fiber, is a type of carbohydrate. It is found foods such as fruits, vegetables, whole grains, and beans. A high-fiber diet can have many health benefits. Your health care provider may recommend a high-fiber diet to help:  Prevent constipation. Fiber can make your bowel movements more regular.  Lower your cholesterol.  Relieve the following conditions: ? Inflammation of veins in the anus (hemorrhoids). ? Inflammation of specific areas of the digestive tract (uncomplicated diverticulosis). ? A problem of the large intestine, also called the colon, that sometimes causes pain and diarrhea (irritable bowel syndrome, or IBS).  Prevent overeating as part of a weight-loss plan.  Prevent heart disease, type 2 diabetes, and certain cancers. What are tips for following this plan? Reading food labels  Check the nutrition facts label on food products for the amount of dietary fiber. Choose foods that have 5 grams of fiber or more per serving.  The goals for recommended daily fiber intake include: ? Men (age 22 or younger): 34-38 g. ? Men (over age 20): 28-34 g. ? Women (age 1 or younger): 25-28 g. ? Women (over age 91): 22-25 g. Your daily fiber goal is _____________ g.   Shopping  Choose whole fruits and vegetables instead of processed forms, such as apple juice or applesauce.  Choose a wide variety of high-fiber foods  such as avocados, lentils, oats, and kidney beans.  Read the nutrition facts label of the foods you choose. Be aware of foods with added fiber. These foods often have high sugar and sodium amounts per serving. Cooking  Use whole-grain flour for baking and cooking.  Cook with brown rice instead of white rice. Meal planning  Start the day with a breakfast that is high in fiber,  such as a cereal that contains 5 g of fiber or more per serving.  Eat breads and cereals that are made with whole-grain flour instead of refined flour or white flour.  Eat brown rice, bulgur wheat, or millet instead of white rice.  Use beans in place of meat in soups, salads, and pasta dishes.  Be sure that half of the grains you eat each day are whole grains. General information  You can get the recommended daily intake of dietary fiber by: ? Eating a variety of fruits, vegetables, grains, nuts, and beans. ? Taking a fiber supplement if you are not able to take in enough fiber in your diet. It is better to get fiber through food than from a supplement.  Gradually increase how much fiber you consume. If you increase your intake of dietary fiber too quickly, you may have bloating, cramping, or gas.  Drink plenty of water to help you digest fiber.  Choose high-fiber snacks, such as berries, raw vegetables, nuts, and popcorn. What foods should I eat? Fruits Berries. Pears. Apples. Oranges. Avocado. Prunes and raisins. Dried figs. Vegetables Sweet potatoes. Spinach. Kale. Artichokes. Cabbage. Broccoli. Cauliflower. Green peas. Carrots. Squash. Grains Whole-grain breads. Multigrain cereal. Oats and oatmeal. Brown rice. Barley. Bulgur wheat. [place]. Quinoa. Bran muffins. Popcorn. Rye wafer crackers. Meats and other proteins Navy beans, kidney beans, and pinto beans. Soybeans. Split peas. Lentils. Nuts and seeds. Dairy Fiber-fortified yogurt. Beverages Fiber-fortified soy milk. Fiber-fortified orange juice. Other foods Fiber bars. The items listed above may not be a complete list of recommended foods and beverages. Contact a dietitian for more information. What foods should I avoid? Fruits Fruit juice. Cooked, strained fruit. Vegetables Fried potatoes. Canned vegetables. Well-cooked vegetables. Grains White bread. Pasta made with refined flour. White rice. Meats and other  proteins Fatty cuts of meat. Fried chicken or fried fish. Dairy Milk. Yogurt. Cream cheese. Sour cream. Fats and oils Butters. Beverages Soft drinks. Other foods Cakes and pastries. The items listed above may not be a complete list of foods and beverages to avoid. Talk with your dietitian about what choices are best for you. Summary  Fiber is a type of carbohydrate. It is found in foods such as fruits, vegetables, whole grains, and beans.  A high-fiber diet has many benefits. It can help to prevent constipation, lower blood cholesterol, aid weight loss, and reduce your risk of heart disease, diabetes, and certain cancers.  Increase your intake of fiber gradually. Increasing fiber too quickly may cause cramping, bloating, and gas. Drink plenty of water while you increase the amount of fiber you consume.  The best sources of fiber include whole fruits and vegetables, whole grains, nuts, seeds, and beans. This information is not intended to replace advice given to you by your health care provider. Make sure you discuss any questions you have with your health care provider. Document Revised: 02/01/2020 Document Reviewed: 02/01/2020 Elsevier Patient Education  2021 [employer].

## 2021-01-15 DIAGNOSIS — L720 Epidermal cyst: Secondary | ICD-10-CM | POA: Diagnosis not present

## 2021-01-18 ENCOUNTER — Other Ambulatory Visit: Payer: Self-pay | Admitting: Interventional Cardiology

## 2021-01-22 DIAGNOSIS — L723 Sebaceous cyst: Secondary | ICD-10-CM | POA: Diagnosis not present

## 2021-03-03 ENCOUNTER — Ambulatory Visit (HOSPITAL_COMMUNITY)
Admission: RE | Admit: 2021-03-03 | Payer: PPO | Source: Ambulatory Visit | Attending: Interventional Cardiology | Admitting: Interventional Cardiology

## 2021-03-03 ENCOUNTER — Encounter (HOSPITAL_COMMUNITY): Payer: Self-pay

## 2021-03-26 ENCOUNTER — Other Ambulatory Visit: Payer: Self-pay

## 2021-03-26 ENCOUNTER — Other Ambulatory Visit: Payer: Self-pay | Admitting: Interventional Cardiology

## 2021-03-26 ENCOUNTER — Ambulatory Visit (HOSPITAL_COMMUNITY)
Admission: RE | Admit: 2021-03-26 | Discharge: 2021-03-26 | Disposition: A | Discharge status: Home / Self Care | Payer: PPO | Source: Ambulatory Visit | Attending: Cardiovascular Disease | Admitting: Cardiovascular Disease

## 2021-03-26 ENCOUNTER — Encounter (HOSPITAL_COMMUNITY): Payer: Self-pay

## 2021-03-26 DIAGNOSIS — Z136 Encounter for screening for cardiovascular disorders: Secondary | ICD-10-CM | POA: Insufficient documentation

## 2021-03-26 DIAGNOSIS — I10 Essential (primary) hypertension: Secondary | ICD-10-CM

## 2021-03-26 DIAGNOSIS — I2510 Atherosclerotic heart disease of native coronary artery without angina pectoris: Secondary | ICD-10-CM

## 2021-03-26 DIAGNOSIS — Z87891 Personal history of nicotine dependence: Secondary | ICD-10-CM

## 2021-03-26 DIAGNOSIS — E782 Mixed hyperlipidemia: Secondary | ICD-10-CM | POA: Diagnosis not present

## 2021-05-01 ENCOUNTER — Ambulatory Visit: Payer: PPO | Admitting: Cardiology

## 2021-05-15 ENCOUNTER — Encounter: Payer: Self-pay | Admitting: Family Medicine

## 2021-05-15 ENCOUNTER — Ambulatory Visit (INDEPENDENT_AMBULATORY_CARE_PROVIDER_SITE_OTHER): Payer: PPO | Admitting: Family Medicine

## 2021-05-15 VITALS — BP 123/68 | HR 61 | Temp 97.9°F | Ht 70.08 in | Wt 199.3 lb

## 2021-05-15 DIAGNOSIS — I2510 Atherosclerotic heart disease of native coronary artery without angina pectoris: Secondary | ICD-10-CM

## 2021-05-15 DIAGNOSIS — R7309 Other abnormal glucose: Secondary | ICD-10-CM | POA: Diagnosis not present

## 2021-05-15 DIAGNOSIS — E782 Mixed hyperlipidemia: Secondary | ICD-10-CM | POA: Diagnosis not present

## 2021-05-15 DIAGNOSIS — I10 Essential (primary) hypertension: Secondary | ICD-10-CM | POA: Diagnosis not present

## 2021-05-15 NOTE — Patient Instructions (Signed)
Nice to meet you today! Have labs completed when fasting.

## 2021-05-18 DIAGNOSIS — R7309 Other abnormal glucose: Secondary | ICD-10-CM | POA: Insufficient documentation

## 2021-05-18 NOTE — Assessment & Plan Note (Signed)
Blood pressure well controlled at this time.  Recommend continuation of enalapril in addition to a low-sodium diet.

## 2021-05-18 NOTE — Assessment & Plan Note (Signed)
He is tolerating atorvastatin well, continue.

## 2021-05-18 NOTE — Progress Notes (Signed)
Jamie Rice - 65 y.o. male MRN [id number]  Date of birth: [birth year]  Subjective Chief Complaint  Patient presents with   Establish Care    HPI Jamie Rice is a 65 year old male here today for initial visit to establish care.  He has history of hypertension, CAD with previous MI, arthritis of the knee, hyperlipidemia.  Reports chronic conditions are stable with current medications.  Blood pressure is managed with enalapril.  Tolerating atorvastatin well for hyperlipidemia.  No myalgias with this.  He does see cardiology (Dr. Irish Lack) who is managing these medications.  He denies any anginal symptoms, shortness of breath, headaches or vision changes.  He would like to have updated blood sugar and A1c checked due to borderline reading in the past.  He denies any symptoms related to this.  ROS:  A comprehensive ROS was completed and negative except as noted per HPI   No Known Allergies  Past Medical History:  Diagnosis Date   Anxiety    Arthritis    Back pain    CAD (coronary artery disease)    Depression    HTN (hypertension)    Hypercholesteremia     Past Surgical History:  Procedure Laterality Date   HERNIA REPAIR     PTCA with stent - [facility]     VASECTOMY      Social History   Socioeconomic History   Marital status: Widowed    Spouse name: Not on file   Number of children: Not on file   Years of education: Not on file   Highest education level: Not on file  Occupational History   Not on file  Tobacco Use   Smoking status: Former   Smokeless tobacco: Never  Vaping Use   Vaping Use: Never used  Substance and Sexual Activity   Alcohol use: Yes   Drug use: No   Sexual activity: Not on file  Other Topics Concern   Not on file  Social History Narrative   Not on file   Social Determinants of Health   Financial Resource Strain: Not on file  Food Insecurity: Not on file  Transportation Needs: Not on file  Physical Activity: Not on file  Stress: Not on  file  Social Connections: Not on file    Family History  Problem Relation Age of Onset   Heart disease Father    Heart attack Father    Hypertension Father    Stroke Father        X4   Hypertension Paternal Uncle        X4   Stroke Paternal Uncle     Health Maintenance  Topic Date Due   HIV Screening  Never done   Hepatitis C Screening  Never done   COLONOSCOPY (Pts 45-53yr Insurance coverage will need to be confirmed)  Never done   Zoster Vaccines- Shingrix (1 of 2) Never done   COVID-19 Vaccine (4 - Booster for Pfizer series) 11/27/2020   PNA vac Low Risk Adult (1 of 2 - PCV13) Never done   INFLUENZA VACCINE  05/12/2021   TETANUS/TDAP  05/28/2028   HPV VACCINES  Aged Out     ----------------------------------------------------------------------------------------------------------------------------------------------------------------------------------------------------------------- Physical Exam BP 123/68 (BP Location: Left Arm, Patient Position: Sitting, Cuff Size: Normal)   Pulse 61   Temp 97.9 F (36.6 C)   Ht 5' 10.08" (1.78 m)   Wt 199 lb 4.8 oz (90.4 kg)   SpO2 97%   BMI 28.53 kg/m   Physical Exam Constitutional:  Appearance: Normal appearance.  Eyes:     General: No scleral icterus. Cardiovascular:     Rate and Rhythm: Normal rate and regular rhythm.  Pulmonary:     Effort: Pulmonary effort is normal.     Breath sounds: Normal breath sounds.  Musculoskeletal:     Cervical back: Neck supple.  Neurological:     General: No focal deficit present.     Mental Status: He is alert.  Psychiatric:        Mood and Affect: Mood normal.        Behavior: Behavior normal.    ------------------------------------------------------------------------------------------------------------------------------------------------------------------------------------------------------------------- Assessment and Plan  HTN (hypertension) Blood pressure well controlled  at this time.  Recommend continuation of enalapril in addition to a low-sodium diet.  CAD (coronary artery disease) Previous MIs status post stenting.  Denies any anginal symptoms.  He will continue to follow with cardiology.  Continue clopidogrel and statin.  Mixed hyperlipidemia He is tolerating atorvastatin well, continue.  Elevated random blood glucose level Update BMP and A1c.   No orders of the defined types were placed in this encounter.   Return in about 6 months (around 11/15/2021) for HTN.    This visit occurred during the SARS-CoV-2 public health emergency.  Safety protocols were in place, including screening questions prior to the visit, additional usage of staff PPE, and extensive cleaning of exam room while observing appropriate contact time as indicated for disinfecting solutions.

## 2021-05-18 NOTE — Assessment & Plan Note (Signed)
Update BMP and A1c.

## 2021-05-18 NOTE — Assessment & Plan Note (Signed)
Previous MIs status post stenting.  Denies any anginal symptoms.  He will continue to follow with cardiology.  Continue clopidogrel and statin.

## 2021-06-28 IMAGING — MR MRI OF THE RIGHT KNEE WITHOUT CONTRAST
7 series · 40 of 40 positions shown · non-contrast
Comparison: None.

CLINICAL DATA: Knee pain for over 6 weeks.  No prior surgery.

EXAM:
MRI OF THE RIGHT KNEE WITHOUT CONTRAST
TECHNIQUE: Multiplanar, multisequence MR imaging of the knee was performed. No
intravenous contrast was administered.

[Series 6: T2 fat-sat · axial · right · 4.0mm · 0.50mm/px · z∈[-116,+38]mm · 8 of 36 slices shown (1 of 3)]
[im 1/36]
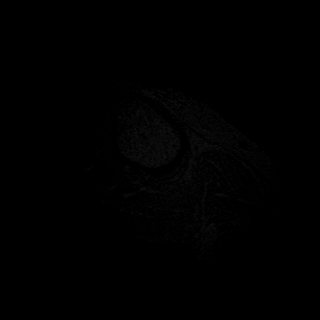
[im 6/36]
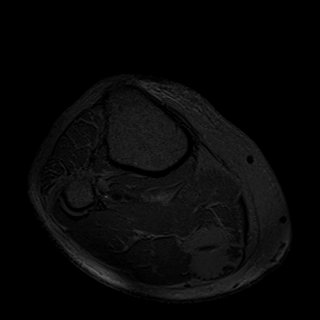
[im 11/36]
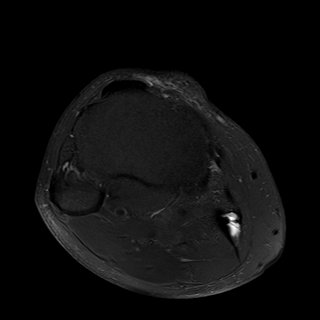
[im 16/36]
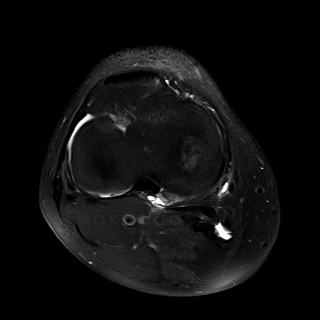
[im 21/36]
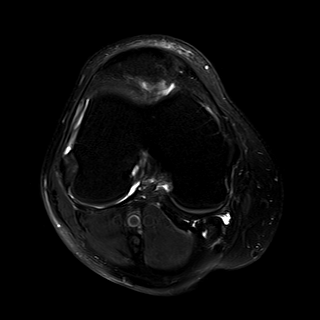
[im 26/36]
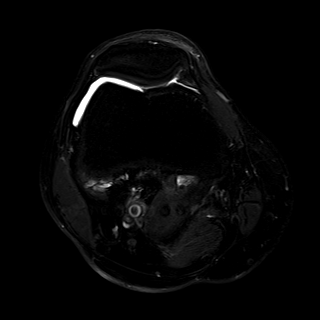
[im 31/36]
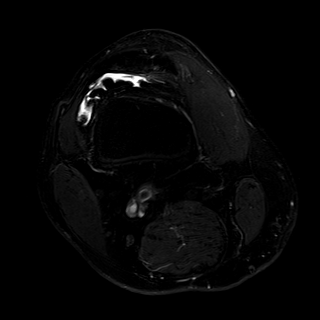
[im 36/36]
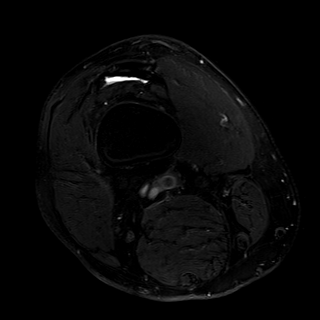

[Series 7: T2 fat-sat · coronal · right · 4.0mm · 0.47mm/px · 5 of 26 slices shown (2 of 3)]
[im 1/26]
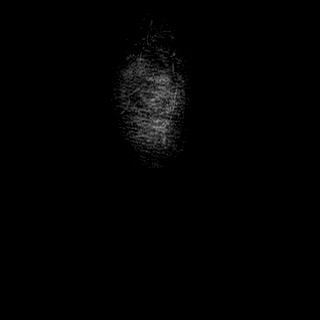
[im 7/26]
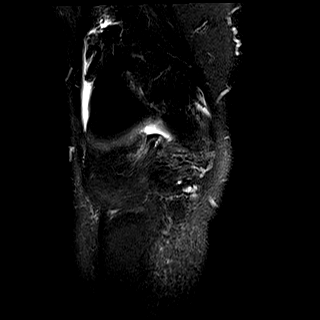
[im 13/26]
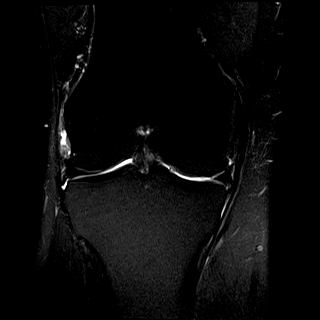
[im 19/26]
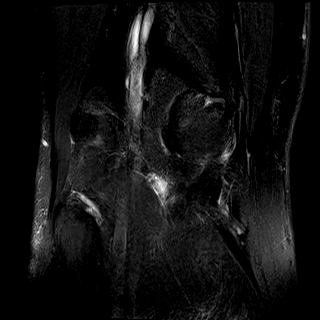
[im 26/26]
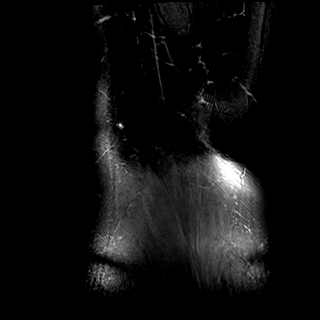

[Series 8: T1 · coronal · right · 4.0mm · 0.47mm/px · 5 of 26 slices shown]
[im 1/26]
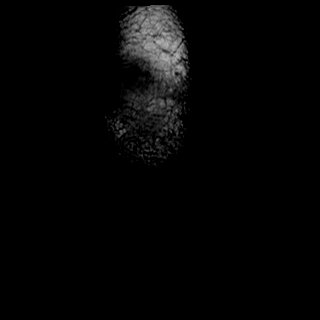
[im 7/26]
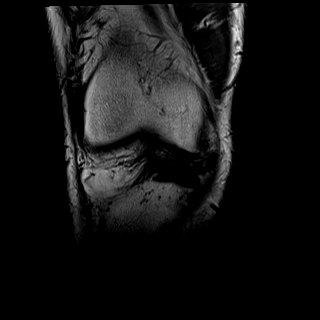
[im 13/26]
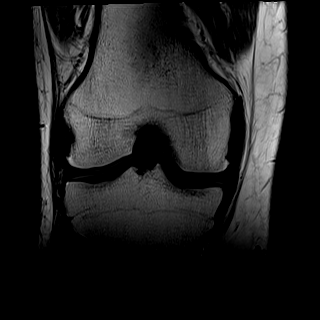
[im 19/26]
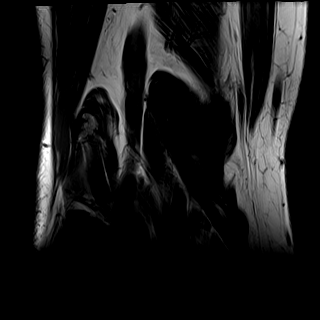
[im 26/26]
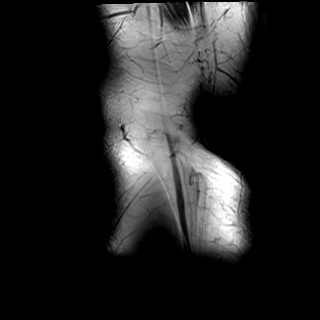

[Series 9: PD fat-sat · coronal · right · 3.0mm · 0.47mm/px · 6 of 31 slices shown (1 of 2)]
[im 1/31]
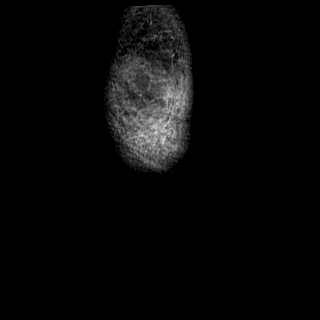
[im 7/31]
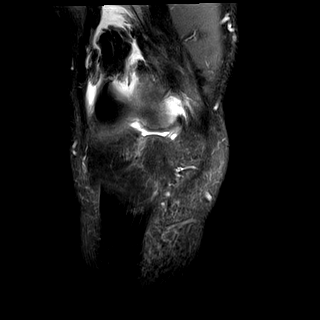
[im 13/31]
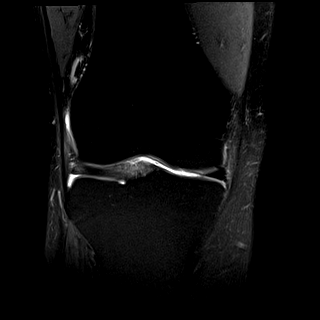
[im 19/31]
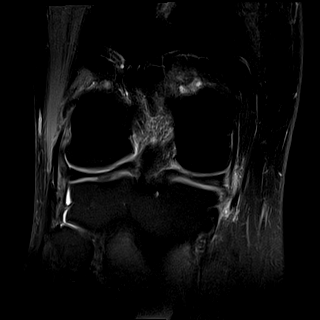
[im 25/31]
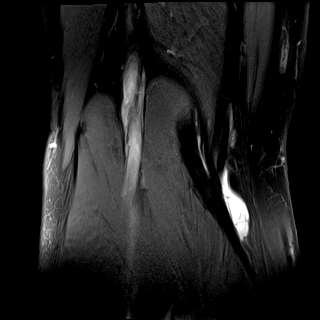
[im 31/31]
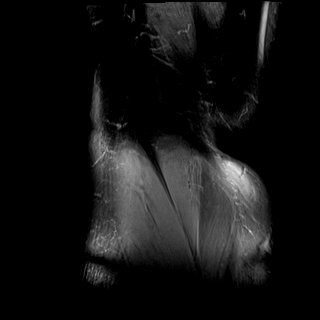

[Series 10: PD fat-sat · sagittal · right · 3.0mm · 0.47mm/px · 6 of 27 slices shown (2 of 2)]
[im 1/27]
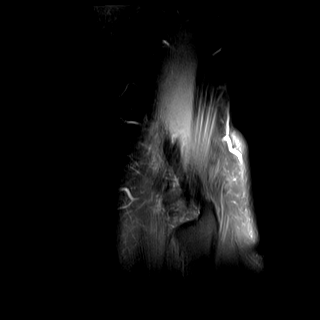
[im 6/27]
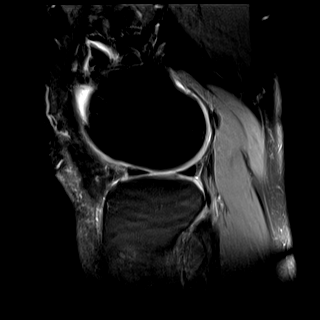
[im 11/27]
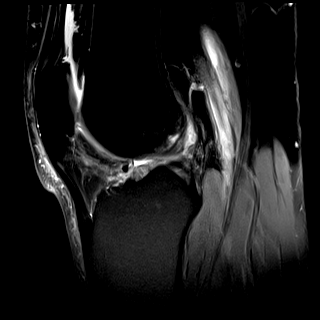
[im 16/27]
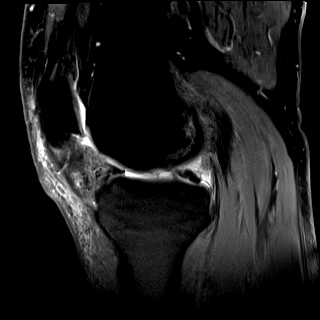
[im 21/27]
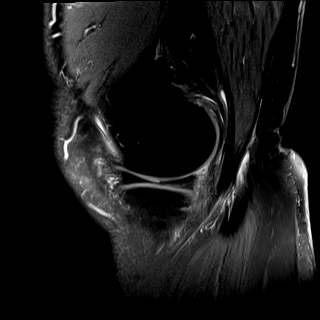
[im 27/27]
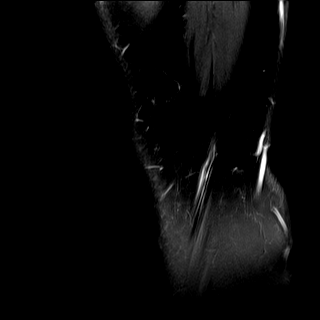

[Series 11: T2 fat-sat · sagittal · right · 3.0mm · 0.47mm/px · 6 of 27 slices shown (3 of 3)]
[im 1/27]
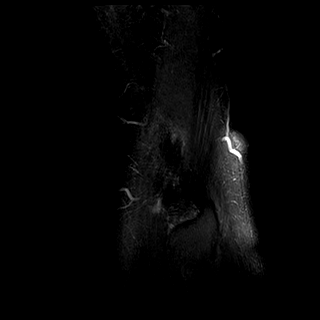
[im 6/27]
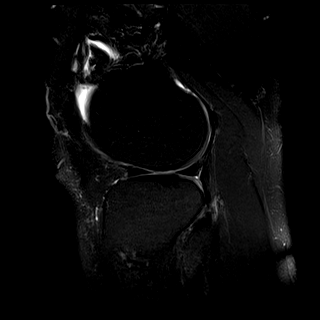
[im 11/27]
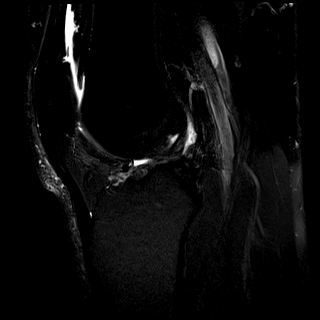
[im 16/27]
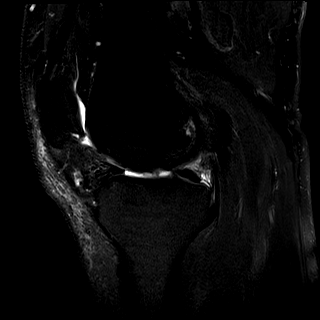
[im 21/27]
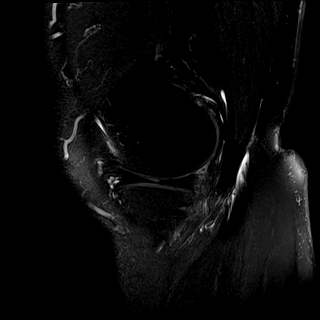
[im 27/27]
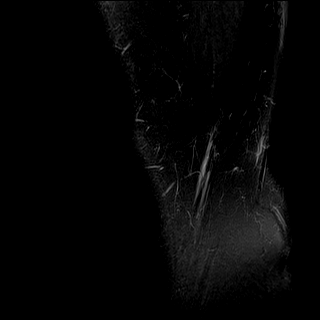

[Series 12: PD · oblique · right · 1.5mm · 0.44mm/px · 4 of 21 slices shown]
[im 1/21]
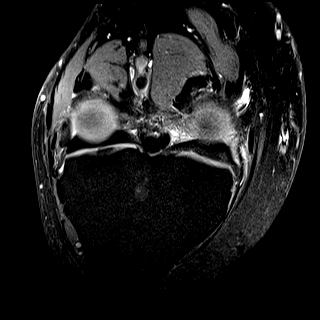
[im 7/21]
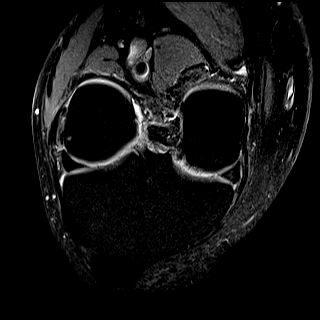
[im 14/21]
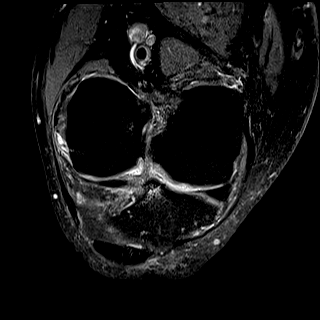
[im 21/21]
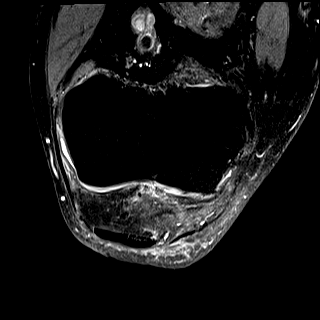

[40 of 40 positions shown; findings below may reference images not displayed]

FINDINGS: MENISCI

Medial meniscus: Small oblique tear of the posterior horn of the
medial meniscus extend to the inferior articular surface. Increased
signal in the posterior horn-body junction of the medial meniscus
consistent with degeneration with a subtle linear component which
does not appear to extend to the articular surface.

Lateral meniscus:  Intact.

LIGAMENTS

Cruciates:  Intact ACL and PCL.

Collaterals: Medial collateral ligament is intact. Lateral
collateral ligament complex is intact.

CARTILAGE

Patellofemoral:  No focal chondral defect.

Medial: Mild partial-thickness cartilage loss of the medial
femorotibial compartment.

Lateral:  No chondral defect.

Joint: Small joint effusion. Edema in Hoffa's fat. No plical
thickening.

Popliteal Fossa: Tiny Baker's cyst. Mild tendinosis of the popliteus
tendon.

Extensor Mechanism: Intact quadriceps tendon. Intact patellar
tendon. Intact medial patellar retinaculum. Intact lateral patellar
retinaculum. Intact MPFL.

Bones:  No acute osseous abnormality.  No aggressive osseous lesion.

Other: No fluid collection or hematoma.
IMPRESSION: 1. Small oblique tear of the posterior horn of the medial meniscus
extend to the inferior articular surface. Increased signal in the
posterior horn-body junction of the medial meniscus consistent with
degeneration with a subtle linear component which does not appear to
extend to the articular surface.
2. Mild partial-thickness cartilage loss of the medial femorotibial
compartment.
3. Small Baker's cyst.

## 2021-11-18 ENCOUNTER — Other Ambulatory Visit: Payer: Self-pay

## 2021-11-18 ENCOUNTER — Ambulatory Visit (INDEPENDENT_AMBULATORY_CARE_PROVIDER_SITE_OTHER): Payer: PPO | Admitting: Family Medicine

## 2021-11-18 ENCOUNTER — Encounter: Payer: Self-pay | Admitting: Family Medicine

## 2021-11-18 VITALS — BP 115/73 | HR 55 | Ht 71.0 in | Wt 200.0 lb

## 2021-11-18 DIAGNOSIS — E782 Mixed hyperlipidemia: Secondary | ICD-10-CM | POA: Diagnosis not present

## 2021-11-18 DIAGNOSIS — I10 Essential (primary) hypertension: Secondary | ICD-10-CM | POA: Diagnosis not present

## 2021-11-18 DIAGNOSIS — R7309 Other abnormal glucose: Secondary | ICD-10-CM

## 2021-11-18 DIAGNOSIS — I2510 Atherosclerotic heart disease of native coronary artery without angina pectoris: Secondary | ICD-10-CM | POA: Diagnosis not present

## 2021-11-18 DIAGNOSIS — R739 Hyperglycemia, unspecified: Secondary | ICD-10-CM

## 2021-11-18 MED ORDER — SHINGRIX 50 MCG/0.5ML IM SUSR: 0.5000 mL | Freq: Once | INTRAMUSCULAR | 0 refills | Status: AC

## 2021-11-18 MED ORDER — SHINGRIX 50 MCG/0.5ML IM SUSR: 0.5000 mL | Freq: Once | INTRAMUSCULAR | 0 refills | Status: DC

## 2021-11-18 NOTE — Assessment & Plan Note (Signed)
Tolerating atorvastatin well.  Continue at current strength.  

## 2021-11-18 NOTE — Patient Instructions (Signed)
Try hydrocortisone cream around ear.  Have labs completed when fasting.

## 2021-11-18 NOTE — Progress Notes (Signed)
Jamie Rice - 66 y.o. male MRN [id number]  Date of birth: 25-Dec-1955  Subjective Chief Complaint  Patient presents with   Hypertension    HPI Jamie Rice is a 66 year old male here today for follow-up visit.  Reports he is doing well overall at this time.  Continues to see cardiology regularly due to history of coronary artery disease as with prior MI.  Blood pressure remains well controlled with current medications for management of hypertension.  Denies side effects from current medication.  He has not had any anginal symptoms.  Continues to tolerate atorvastatin well for management of lipids.  ROS:  A comprehensive ROS was completed and negative except as noted per HPI  No Known Allergies  Past Medical History:  Diagnosis Date   Anxiety    Arthritis    Back pain    CAD (coronary artery disease)    Depression    HTN (hypertension)    Hypercholesteremia     Past Surgical History:  Procedure Laterality Date   HERNIA REPAIR     PTCA with stent - [facility]     VASECTOMY      Social History   Socioeconomic History   Marital status: Widowed    Spouse name: Not on file   Number of children: Not on file   Years of education: Not on file   Highest education level: Not on file  Occupational History   Not on file  Tobacco Use   Smoking status: Former   Smokeless tobacco: Never  Vaping Use   Vaping Use: Never used  Substance and Sexual Activity   Alcohol use: Yes   Drug use: No   Sexual activity: Not on file  Other Topics Concern   Not on file  Social History Narrative   Not on file   Social Determinants of Health   Financial Resource Strain: Not on file  Food Insecurity: Not on file  Transportation Needs: Not on file  Physical Activity: Not on file  Stress: Not on file  Social Connections: Not on file    Family History  Problem Relation Age of Onset   Heart disease Father    Heart attack Father    Hypertension Father    Stroke Father        X4    Hypertension Paternal Uncle        X4   Stroke Paternal Uncle     Health Maintenance  Topic Date Due   HIV Screening  Never done   Hepatitis C Screening  Never done   COLONOSCOPY (Pts 45-52yrs Insurance coverage will need to be confirmed)  Never done   Zoster Vaccines- Shingrix (1 of 2) Never done   Pneumonia Vaccine 51+ Years old (1 - PCV) 11/18/2022 (Originally 02/26/2021)   TETANUS/TDAP  05/28/2028   INFLUENZA VACCINE  Completed   COVID-19 Vaccine  Completed   HPV VACCINES  Aged Out     ----------------------------------------------------------------------------------------------------------------------------------------------------------------------------------------------------------------- Physical Exam BP 115/73 (BP Location: Left Arm, Patient Position: Sitting, Cuff Size: Large)    Pulse (!) 55    Ht 5\' 11"  (1.803 m)    Wt 200 lb (90.7 kg)    SpO2 96%    BMI 27.89 kg/m   Physical Exam Constitutional:      Appearance: Normal appearance.  Eyes:     General: No scleral icterus. Cardiovascular:     Rate and Rhythm: Normal rate and regular rhythm.  Pulmonary:     Effort: Pulmonary effort is normal.  Breath sounds: Normal breath sounds.  Musculoskeletal:     Cervical back: Neck supple.  Neurological:     General: No focal deficit present.     Mental Status: He is alert.  Psychiatric:        Mood and Affect: Mood normal.        Behavior: Behavior normal.    ------------------------------------------------------------------------------------------------------------------------------------------------------------------------------------------------------------------- Assessment and Plan  HTN (hypertension) Blood pressure remains well controlled.  Continue enalapril at current strength.  Updated labs ordered today.  CAD (coronary artery disease) Management per cardiology.  No anginal symptoms at this time.  Continue Plavix and statin.  Mixed  hyperlipidemia Tolerating atorvastatin well.  Continue at current strength.  Elevated random blood glucose level A1c ordered.   Meds ordered this encounter  Medications   DISCONTD: Zoster Vaccine Adjuvanted [facility]) injection    Sig: Inject 0.5 mLs into the muscle once for 1 dose.    Dispense:  0.5 mL    Refill:  0   Zoster Vaccine Adjuvanted [facility]) injection    Sig: Inject 0.5 mLs into the muscle once for 1 dose.    Dispense:  0.5 mL    Refill:  0    Return in about 6 months (around 05/18/2022) for HTN.    This visit occurred during the SARS-CoV-2 public health emergency.  Safety protocols were in place, including screening questions prior to the visit, additional usage of staff PPE, and extensive cleaning of exam room while observing appropriate contact time as indicated for disinfecting solutions.

## 2021-11-18 NOTE — Assessment & Plan Note (Signed)
Management per cardiology.  No anginal symptoms at this time.  Continue Plavix and statin.

## 2021-11-18 NOTE — Assessment & Plan Note (Signed)
Blood pressure remains well controlled.  Continue enalapril at current strength.  Updated labs ordered today.

## 2021-11-18 NOTE — Assessment & Plan Note (Signed)
A1c ordered.

## 2021-11-19 LAB — LIPID PANEL W/REFLEX DIRECT LDL
Cholesterol: 136 mg/dL (ref <200)
HDL: 43 mg/dL (ref >=40)
LDL Cholesterol (Calc): 70 mg/dL (calc)
Non-HDL Cholesterol (Calc): 93 mg/dL (calc) (ref <130)
Total CHOL/HDL Ratio: 3.2 (calc) (ref <5.0)
Triglycerides: 145 mg/dL (ref <150)

## 2021-11-19 LAB — COMPLETE METABOLIC PANEL WITH GFR
AG Ratio: 2.4 (calc) (ref 1.0–2.5)
ALT: 22 U/L (ref 9–46)
AST: 19 U/L (ref 10–35)
Albumin: 4.4 g/dL (ref 3.6–5.1)
Alkaline phosphatase (APISO): 93 U/L (ref 35–144)
BUN: 15 mg/dL (ref 7–25)
CO2: 28 mmol/L (ref 20–32)
Calcium: 9.6 mg/dL (ref 8.6–10.3)
Chloride: 102 mmol/L (ref 98–110)
Creat: 0.89 mg/dL (ref 0.70–1.35)
Globulin: 1.8 g/dL (calc) — ABNORMAL LOW (ref 1.9–3.7)
Glucose, Bld: 126 mg/dL — ABNORMAL HIGH (ref 65–99)
Potassium: 4.3 mmol/L (ref 3.5–5.3)
Sodium: 136 mmol/L (ref 135–146)
Total Bilirubin: 0.8 mg/dL (ref 0.2–1.2)
Total Protein: 6.2 g/dL (ref 6.1–8.1)
eGFR: 95 mL/min/{1.73_m2} (ref >=60)

## 2021-11-19 LAB — CBC WITH DIFFERENTIAL/PLATELET
Absolute Monocytes: 932 cells/uL (ref 200–950)
Basophils Absolute: 76 cells/uL (ref 0–200)
Basophils Relative: 0.6 %
Eosinophils Absolute: 353 cells/uL (ref 15–500)
Eosinophils Relative: 2.8 %
HCT: 42.3 % (ref 38.5–50.0)
Hemoglobin: 14.7 g/dL (ref 13.2–17.1)
Lymphs Abs: 3906 cells/uL — ABNORMAL HIGH (ref 850–3900)
MCH: 31.5 pg (ref 27.0–33.0)
MCHC: 34.8 g/dL (ref 32.0–36.0)
MCV: 90.8 fL (ref 80.0–100.0)
MPV: 11.3 fL (ref 7.5–12.5)
Monocytes Relative: 7.4 %
Neutro Abs: 7333 cells/uL (ref 1500–7800)
Neutrophils Relative %: 58.2 %
Platelets: 323 10*3/uL (ref 140–400)
RBC: 4.66 10*6/uL (ref 4.20–5.80)
RDW: 11.6 % (ref 11.0–15.0)
Total Lymphocyte: 31 %
WBC: 12.6 10*3/uL — ABNORMAL HIGH (ref 3.8–10.8)

## 2021-11-19 LAB — HEMOGLOBIN A1C
Hgb A1c MFr Bld: 10.3 % of total Hgb — ABNORMAL HIGH (ref <5.7)
Mean Plasma Glucose: 249 mg/dL
eAG (mmol/L): 13.8 mmol/L

## 2021-11-21 ENCOUNTER — Other Ambulatory Visit: Payer: Self-pay | Admitting: Family Medicine

## 2021-11-21 DIAGNOSIS — D72829 Elevated white blood cell count, unspecified: Secondary | ICD-10-CM

## 2021-11-21 MED ORDER — XIGDUO XR 10-1000 MG PO TB24: 1.0000 | ORAL_TABLET | Freq: Every day | ORAL | 3 refills | Status: DC

## 2022-01-26 ENCOUNTER — Other Ambulatory Visit: Payer: Self-pay

## 2022-01-26 MED ORDER — XIGDUO XR 10-1000 MG PO TB24: 1.0000 | ORAL_TABLET | Freq: Every day | ORAL | 3 refills | Status: DC

## 2022-02-11 ENCOUNTER — Other Ambulatory Visit: Payer: Self-pay | Admitting: Interventional Cardiology
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
## 2022-02-16 ENCOUNTER — Ambulatory Visit: Payer: PPO | Admitting: Interventional Cardiology

## 2022-02-16 ENCOUNTER — Encounter: Payer: Self-pay | Admitting: Interventional Cardiology

## 2022-02-16 VITALS — BP 122/76 | HR 49 | Ht 71.0 in | Wt 189.0 lb

## 2022-02-16 DIAGNOSIS — I2510 Atherosclerotic heart disease of native coronary artery without angina pectoris: Secondary | ICD-10-CM | POA: Diagnosis not present

## 2022-02-16 DIAGNOSIS — E782 Mixed hyperlipidemia: Secondary | ICD-10-CM | POA: Diagnosis not present

## 2022-02-16 DIAGNOSIS — I252 Old myocardial infarction: Secondary | ICD-10-CM

## 2022-02-16 DIAGNOSIS — I10 Essential (primary) hypertension: Secondary | ICD-10-CM

## 2022-02-16 DIAGNOSIS — Z87891 Personal history of nicotine dependence: Secondary | ICD-10-CM | POA: Diagnosis not present

## 2022-02-16 MED ORDER — NITROGLYCERIN 0.4 MG SL SUBL: 0.4000 mg | SUBLINGUAL_TABLET | SUBLINGUAL | 6 refills | Status: DC | PRN

## 2022-02-16 NOTE — Patient Instructions (Signed)
Medication Instructions:  ?Your physician recommends that you continue on your current medications as directed. Please refer to the Current Medication list given to you today. ? ?*If you need a refill on your cardiac medications before your next appointment, please call your pharmacy* ? ? ?Lab Work: ?none ?If you have labs (blood work) drawn today and your tests are completely normal, you will receive your results only by: ?MyChart Message (if you have MyChart) OR ?A paper copy in the mail ?If you have any lab test that is abnormal or we need to change your treatment, we will call you to review the results. ? ? ?Testing/Procedures: ?none ? ? ?Follow-Up: ?At [facility], you and your health needs are our priority.  As part of our continuing mission to provide you with exceptional heart care, we have created designated Provider Care Teams.  These Care Teams include your primary Cardiologist (physician) and Advanced Practice Providers (APPs -  Physician Assistants and Nurse Practitioners) who all work together to provide you with the care you need, when you need it. ? ?We recommend signing up for the patient portal called "MyChart".  Sign up information is provided on this After Visit Summary.  MyChart is used to connect with patients for Virtual Visits (Telemedicine).  Patients are able to view lab/test results, encounter notes, upcoming appointments, etc.  Non-urgent messages can be sent to your provider as well.   ?To learn more about what you can do with MyChart, go to [email or web address].   ? ?Your next appointment:   ?12 month(s) ? ?The format for your next appointment:   ?In Person ? ?Provider:   ?Jayadeep Varanasi, MD   ? ? ? ? ?Important Information About Sugar ? ? ? ? ? ? ?

## 2022-05-10 ENCOUNTER — Other Ambulatory Visit: Payer: Self-pay | Admitting: Interventional Cardiology

## 2022-05-18 ENCOUNTER — Ambulatory Visit (INDEPENDENT_AMBULATORY_CARE_PROVIDER_SITE_OTHER): Payer: PPO | Admitting: Family Medicine

## 2022-05-18 ENCOUNTER — Encounter: Payer: Self-pay | Admitting: Family Medicine

## 2022-05-18 VITALS — BP 132/74 | HR 60 | Ht 71.0 in | Wt 187.0 lb

## 2022-05-18 DIAGNOSIS — E1169 Type 2 diabetes mellitus with other specified complication: Secondary | ICD-10-CM

## 2022-05-18 DIAGNOSIS — R7309 Other abnormal glucose: Secondary | ICD-10-CM | POA: Diagnosis not present

## 2022-05-18 DIAGNOSIS — I10 Essential (primary) hypertension: Secondary | ICD-10-CM

## 2022-05-18 DIAGNOSIS — I2510 Atherosclerotic heart disease of native coronary artery without angina pectoris: Secondary | ICD-10-CM | POA: Diagnosis not present

## 2022-05-18 DIAGNOSIS — E782 Mixed hyperlipidemia: Secondary | ICD-10-CM

## 2022-05-18 DIAGNOSIS — F419 Anxiety disorder, unspecified: Secondary | ICD-10-CM | POA: Diagnosis not present

## 2022-05-18 DIAGNOSIS — E119 Type 2 diabetes mellitus without complications: Secondary | ICD-10-CM | POA: Insufficient documentation

## 2022-05-18 LAB — POCT GLYCOSYLATED HEMOGLOBIN (HGB A1C): HbA1c, POC (controlled diabetic range): 6.7 % (ref 0.0–7.0)

## 2022-05-18 NOTE — Assessment & Plan Note (Signed)
Tolerating atorvastatin well.  Recommend continuation of current strength.

## 2022-05-18 NOTE — Assessment & Plan Note (Signed)
Blood pressure been fairly well-controlled at this time.  He will continue current medications for management of hypertension.

## 2022-05-18 NOTE — Progress Notes (Signed)
Jamie Rice - 66 y.o. male MRN [id number]  Date of birth: 1955/10/22  Subjective No chief complaint on file.   HPI Jamie Rice is a 66 y.o. male here today for follow up visit.   Continues on enalapril for management of HTN.  Tolerating well.  He does see cardiology as well, history of inferior MI in 2014.  Denies chest pain, shortness of breath ,palpitations, headache or vision changes.    He has uncontrolled diabetes with last a1c of 10.5%.  Current treatment with xigduo.  He is tolerating this well without side effects.  He is not monitoring blood sugars at home.   Continues on lipitor for HLD.  Tolerating well.   Has had some increased stress recently related to caring for his father.  His father has had a couple of falls and he has moved into assisted living.  He is responsible for selling his townhome as he got overwhelmed with all the responsibilities related to this.  His daughter did recently come in from [place] to help him.  He is not interested in pursuing any medication or therapy to help with this.  ROS:  A comprehensive ROS was completed and negative except as noted per HPI  No Known Allergies  Past Medical History:  Diagnosis Date   Anxiety    Arthritis    Back pain    CAD (coronary artery disease)    Depression    HTN (hypertension)    Hypercholesteremia     Past Surgical History:  Procedure Laterality Date   HERNIA REPAIR     PTCA with stent - [facility]     VASECTOMY      Social History   Socioeconomic History   Marital status: Widowed    Spouse name: Not on file   Number of children: Not on file   Years of education: Not on file   Highest education level: Not on file  Occupational History   Not on file  Tobacco Use   Smoking status: Former   Smokeless tobacco: Never  Vaping Use   Vaping Use: Never used  Substance and Sexual Activity   Alcohol use: Yes   Drug use: No   Sexual activity: Not on file  Other Topics Concern   Not on  file  Social History Narrative   Not on file   Social Determinants of Health   Financial Resource Strain: Not on file  Food Insecurity: Not on file  Transportation Needs: Not on file  Physical Activity: Not on file  Stress: Not on file  Social Connections: Not on file    Family History  Problem Relation Age of Onset   Heart disease Father    Heart attack Father    Hypertension Father    Stroke Father        X4   Hypertension Paternal Uncle        X4   Stroke Paternal Uncle     Health Maintenance  Topic Date Due   FOOT EXAM  Never done   OPHTHALMOLOGY EXAM  Never done   INFLUENZA VACCINE  05/12/2022   COVID-19 Vaccine (5 - Pfizer series) 06/03/2022 (Originally 11/29/2021)   Zoster Vaccines- Shingrix (2 of 2) 08/18/2022 (Originally 05/14/2022)   Pneumonia Vaccine 64+ Years old (1 - PCV) 11/18/2022 (Originally 02/26/2021)   COLONOSCOPY (Pts 45-12yr Insurance coverage will need to be confirmed)  05/19/2023 (Originally 02/26/2001)   Hepatitis C Screening  05/19/2023 (Originally 02/26/1974)   HEMOGLOBIN A1C  11/18/2022  TETANUS/TDAP  05/28/2028   HPV VACCINES  Aged Out     ----------------------------------------------------------------------------------------------------------------------------------------------------------------------------------------------------------------- Physical Exam BP 132/74 (BP Location: Left Arm, Patient Position: Sitting, Cuff Size: Large)   Pulse 60   Ht '5\' 11"'$  (1.803 m)   Wt 187 lb (84.8 kg)   SpO2 97%   BMI 26.08 kg/m   Physical Exam Constitutional:      Appearance: Normal appearance.  Eyes:     General: No scleral icterus. Cardiovascular:     Rate and Rhythm: Normal rate and regular rhythm.  Pulmonary:     Effort: Pulmonary effort is normal.     Breath sounds: Normal breath sounds.  Musculoskeletal:     Cervical back: Neck supple.  Neurological:     Mental Status: He is alert.  Psychiatric:        Mood and Affect: Mood  normal.        Behavior: Behavior normal.     ------------------------------------------------------------------------------------------------------------------------------------------------------------------------------------------------------------------- Assessment and Plan  HTN (hypertension) Blood pressure been fairly well-controlled at this time.  He will continue current medications for management of hypertension.  CAD (coronary artery disease) Followed by cardiology.  Stable at this time.  Mixed hyperlipidemia Tolerating atorvastatin well.  Recommend continuation of current strength.  Anxiety Reports anxiety related to caregiver stress.  His daughter did recently arrive which he thinks will provide some relief.  Declined any treatment this time.  Type 2 diabetes mellitus ([place]) Managed with [place].  Has had improvement of A1c to 6.7%.  Will plan to continue medication at current strength.  Continue to work on dietary change.   No orders of the defined types were placed in this encounter.   No follow-ups on file.   35 minutes spent including pre visit preparation, review of prior notes and labs, encounter with patient and same day documentation.

## 2022-05-18 NOTE — Assessment & Plan Note (Signed)
Followed by cardiology.  Stable at this time.

## 2022-05-18 NOTE — Assessment & Plan Note (Signed)
Managed with [place].  Has had improvement of A1c to 6.7%.  Will plan to continue medication at current strength.  Continue to work on dietary change.

## 2022-05-18 NOTE — Assessment & Plan Note (Signed)
Reports anxiety related to caregiver stress.  His daughter did recently arrive which he thinks will provide some relief.  Declined any treatment this time.

## 2022-05-18 NOTE — Patient Instructions (Addendum)
Continue current medications Follow up in 6 months or sooner if needed.   We'll be in touch once the paperwork is completed.

## 2022-05-22 ENCOUNTER — Telehealth: Payer: Self-pay

## 2022-05-22 NOTE — Telephone Encounter (Signed)
Patient notified

## 2022-05-22 NOTE — Telephone Encounter (Signed)
Please contact the pt stating medical clearance documents completed by Dr. Zigmund Daniel and available for pick-up. Documents have been faxed to the Dentist with confirmation.   Thanks

## 2022-05-29 ENCOUNTER — Ambulatory Visit (INDEPENDENT_AMBULATORY_CARE_PROVIDER_SITE_OTHER): Payer: PPO | Admitting: Medical-Surgical

## 2022-05-29 DIAGNOSIS — Z1211 Encounter for screening for malignant neoplasm of colon: Secondary | ICD-10-CM

## 2022-05-29 DIAGNOSIS — Z1212 Encounter for screening for malignant neoplasm of rectum: Secondary | ICD-10-CM

## 2022-05-29 DIAGNOSIS — Z0000 Encounter for general adult medical examination without abnormal findings: Secondary | ICD-10-CM

## 2022-05-29 NOTE — Patient Instructions (Addendum)
[facility] Maintenance Summary and Written Plan of Care  Mr. Jamie Rice ,  Thank you for allowing me to perform your Medicare Annual Wellness Visit and for your ongoing commitment to your health.   Health Maintenance & Immunization History Health Maintenance  Topic Date Due  . OPHTHALMOLOGY EXAM  05/29/2022 (Originally 02/26/1966)  . Diabetic kidney evaluation - Urine ACR  05/30/2022 (Originally 02/26/1974)  . FOOT EXAM  05/30/2022 (Originally 02/26/1966)  . COVID-19 Vaccine (5 - Pfizer series) 06/03/2022 (Originally 11/29/2021)  . Pneumonia Vaccine 55+ Years old (1 - PCV) 11/18/2022 (Originally 02/26/2021)  . INFLUENZA VACCINE  01/10/2023 (Originally 05/12/2022)  . COLONOSCOPY (Pts 45-41yr Insurance coverage will need to be confirmed)  05/19/2023 (Originally 02/26/2001)  . Hepatitis C Screening  05/19/2023 (Originally [birth year]26/1974)  . Diabetic kidney evaluation - GFR measurement  11/18/2022  . HEMOGLOBIN A1C  11/18/2022  . TETANUS/TDAP  05/28/2028  . Zoster Vaccines- Shingrix  Completed  . HPV VACCINES  Aged Out   Immunization History  Administered Date(s) Administered  . Fluad Quad(high Dose 65+) 07/29/2021  . Influenza,inj,Quad PF,6+ Mos 08/02/2017  . PFIZER(Purple Top)SARS-COV-2 Vaccination 12/20/2019, 01/10/2020, 07/27/2020  . PPension scheme manager164yr& up 07/29/2021  . Tdap 05/28/2018  . Zoster Recombinat (Shingrix) 03/19/2022, 05/11/2022    These are the patient goals that we discussed:  Goals Addressed              This Visit's Progress   .  Patient Stated (pt-stated)        05/29/2022 AWV Goal: Diabetes Management  Patient will maintain an A1C level below 6.0 Patient will not develop any diabetic foot complications Patient will not experience any hypoglycemic episodes over the next 3 months Patient will notify our office of any CBG readings outside of the provider recommended range by calling [phone] will  adhere to provider recommendations for diabetes management  Patient Self Management Activities take all medications as prescribed and report any negative side effects monitor and record blood sugar readings as directed adhere to a low carbohydrate diet that incorporates lean proteins, vegetables, whole grains, low glycemic fruits check feet daily noting any sores, cracks, injuries, or callous formations see PCP or podiatrist if he notices any changes in his legs, feet, or toenails Patient will visit PCP and have an A1C level checked every 3 to 6 months as directed  have a yearly eye exam to monitor for vascular changes associated with diabetes and will request that the report be sent to his pcp.  consult with his PCP regarding any changes in his health or new or worsening symptoms         This is a list of Health Maintenance Items that are overdue or due now: Pneumococcal vaccine  Influenza vaccine Colorectal cancer screening Foot exam Urine ACR Eye exam  Orders/Referrals Placed Today: Orders Placed This Encounter  Procedures  . Ambulatory referral to Gastroenterology (for Colonoscopy)    Referral Priority:   Routine    Referral Type:   Consultation    Referral Reason:   Specialty Services Required    Number of Visits Requested:   1    (Contact our referral department at [phone] you have not spoken with someone about your referral appointment within the next 5 days)    Follow-up Plan Follow-up with MaLuetta NuttingDO as planned Foot exam and urine ACR can be done at your next visit.  Flu shot can be done at the pharmacy or  our office. Please schedule your eye exam with diabetic eye screening. Discuss pneumonia shot with PCP at your next appointment.  Colonoscopy referral has been sent and they will call you to schedule. Medicare wellness visit in one year. AVS printed and mailed to the patient.      Health Maintenance, Male Adopting a healthy lifestyle and  getting preventive care are important in promoting health and wellness. Ask your health care provider about: The right schedule for you to have regular tests and exams. Things you can do on your own to prevent diseases and keep yourself healthy. What should I know about diet, weight, and exercise? Eat a healthy diet  Eat a diet that includes plenty of vegetables, fruits, low-fat dairy products, and lean protein. Do not eat a lot of foods that are high in solid fats, added sugars, or sodium. Maintain a healthy weight Body mass index (BMI) is a measurement that can be used to identify possible weight problems. It estimates body fat based on height and weight. Your health care provider can help determine your BMI and help you achieve or maintain a healthy weight. Get regular exercise Get regular exercise. This is one of the most important things you can do for your health. Most adults should: Exercise for at least 150 minutes each week. The exercise should increase your heart rate and make you sweat (moderate-intensity exercise). Do strengthening exercises at least twice a week. This is in addition to the moderate-intensity exercise. Spend less time sitting. Even light physical activity can be beneficial. Watch cholesterol and blood lipids Have your blood tested for lipids and cholesterol at 66 years of age, then have this test every 5 years. You may need to have your cholesterol levels checked more often if: Your lipid or cholesterol levels are high. You are older than 65 years of age. You are at high risk for heart disease. What should I know about cancer screening? Many types of cancers can be detected early and may often be prevented. Depending on your health history and family history, you may need to have cancer screening at various ages. This may include screening for: Colorectal cancer. Prostate cancer. Skin cancer. Lung cancer. What should I know about heart disease, diabetes, and  high blood pressure? Blood pressure and heart disease High blood pressure causes heart disease and increases the risk of stroke. This is more likely to develop in people who have high blood pressure readings or are overweight. Talk with your health care provider about your target blood pressure readings. Have your blood pressure checked: Every 3-5 years if you are 103-39 years of age. Every year if you are 29 years old or older. If you are between the ages of 79 and 45 and are a current or former smoker, ask your health care provider if you should have a one-time screening for abdominal aortic aneurysm (AAA). Diabetes Have regular diabetes screenings. This checks your fasting blood sugar level. Have the screening done: Once every three years after age 45 if you are at a normal weight and have a low risk for diabetes. More often and at a younger age if you are overweight or have a high risk for diabetes. What should I know about preventing infection? Hepatitis B If you have a higher risk for hepatitis B, you should be screened for this virus. Talk with your health care provider to find out if you are at risk for hepatitis B infection. Hepatitis C Blood testing is recommended for: Everyone  born from [birth year] through 1965. Anyone with known risk factors for hepatitis C. Sexually transmitted infections (STIs) You should be screened each year for STIs, including gonorrhea and chlamydia, if: You are sexually active and are younger than 66 years of age. You are older than 66 years of age and your health care provider tells you that you are at risk for this type of infection. Your sexual activity has changed since you were last screened, and you are at increased risk for chlamydia or gonorrhea. Ask your health care provider if you are at risk. Ask your health care provider about whether you are at high risk for HIV. Your health care provider may recommend a prescription medicine to help prevent HIV  infection. If you choose to take medicine to prevent HIV, you should first get tested for HIV. You should then be tested every 3 months for as long as you are taking the medicine. Follow these instructions at home: Alcohol use Do not drink alcohol if your health care provider tells you not to drink. If you drink alcohol: Limit how much you have to 0-2 drinks a day. Know how much alcohol is in your drink. In the [place] one drink equals one 12 oz bottle of beer (355 mL), one 5 oz glass of wine (148 mL), or one 1 oz glass of hard liquor (44 mL). Lifestyle Do not use any products that contain nicotine or tobacco. These products include cigarettes, chewing tobacco, and vaping devices, such as e-cigarettes. If you need help quitting, ask your health care provider. Do not use street drugs. Do not share needles. Ask your health care provider for help if you need support or information about quitting drugs. General instructions Schedule regular health, dental, and eye exams. Stay current with your vaccines. Tell your health care provider if: You often feel depressed. You have ever been abused or do not feel safe at home. Summary Adopting a healthy lifestyle and getting preventive care are important in promoting health and wellness. Follow your health care provider's instructions about healthy diet, exercising, and getting tested or screened for diseases. Follow your health care provider's instructions on monitoring your cholesterol and blood pressure. This information is not intended to replace advice given to you by your health care provider. Make sure you discuss any questions you have with your health care provider. Document Revised: 02/17/2021 Document Reviewed: 02/17/2021 Elsevier Patient Education  [place].

## 2022-05-29 NOTE — Progress Notes (Signed)
MEDICARE ANNUAL WELLNESS VISIT  05/29/2022  Telephone Visit Disclaimer This Medicare AWV was conducted by telephone due to national recommendations for restrictions regarding the COVID-19 Pandemic (e.g. social distancing).  I verified, using two identifiers, that I am speaking with Jamie Rice or their authorized healthcare agent. I discussed the limitations, risks, security, and privacy concerns of performing an evaluation and management service by telephone and the potential availability of an in-person appointment in the future. The patient expressed understanding and agreed to proceed.  Location of Patient: Home Location of Provider (nurse):  In the office.  Subjective:    Jamie Rice is a 66 y.o. male patient of Luetta Nutting, DO who had a Medicare Annual Wellness Visit today via telephone. Jamie Rice is Working part time and lives alone. he has 2 children. he reports that he is socially active and does interact with friends/family regularly. he is moderately physically active and enjoys cooking and bowling.  Patient Care Team: Luetta Nutting, DO as PCP - General (Family Medicine) Jettie Booze, MD as PCP - Cardiology (Cardiology)     05/29/2022    1:13 PM  Advanced Directives  Does Patient Have a Medical Advance Directive? Yes  Type of Advance Directive Living will  Does patient want to make changes to medical advance directive? No - Patient declined    Hospital Utilization Over the Past 12 Months: # of hospitalizations or ER visits: 0 # of surgeries: 0  Review of Systems    Patient reports that his overall health is unchanged compared to last year.  History obtained from chart review and the patient  Patient Reported Readings (BP, Pulse, CBG, Weight, etc) none  Pain Assessment Pain : No/denies pain     Current Medications & Allergies (verified) Allergies as of 05/29/2022   No Known Allergies      Medication List        Accurate as of  May 29, 2022  1:34 PM. If you have any questions, ask your nurse or doctor.          atorvastatin 80 MG tablet Commonly known as: LIPITOR TAKE ONE TABLET BY MOUTH ONE TIME DAILY   Cinnamon 500 MG capsule Take 500 mg by mouth 2 (two) times daily.   clopidogrel 75 MG tablet Commonly known as: PLAVIX TAKE ONE TABLET BY MOUTH ONE TIME DAILY   D3-1000 25 MCG (1000 UT) capsule Generic drug: Cholecalciferol Take 2,000 Units by mouth daily.   enalapril 10 MG tablet Commonly known as: VASOTEC TAKE ONE TABLET BY MOUTH ONE TIME DAILY   FISH OIL PO Take 1,200 mg by mouth 2 (two) times daily.   nitroGLYCERIN 0.4 MG SL tablet Commonly known as: NITROSTAT Place 1 tablet (0.4 mg total) under the tongue every 5 (five) minutes as needed for chest pain.   NON FORMULARY Take 1 capsule by mouth every morning. TUMERIC   Xigduo XR 07-999 MG Tb24 Generic drug: Dapagliflozin-metFORMIN HCl ER Take 1 tablet by mouth daily.        History (reviewed): Past Medical History:  Diagnosis Date   Anxiety    Arthritis    Back pain    CAD (coronary artery disease)    Depression    HTN (hypertension)    Hypercholesteremia    Past Surgical History:  Procedure Laterality Date   HERNIA REPAIR     PTCA with stent - [place]     VASECTOMY     Family History  Problem Relation Age of  Onset   Heart disease Father    Heart attack Father    Hypertension Father    Stroke Father        X4   Hypertension Paternal Uncle        X4   Stroke Paternal Uncle    Social History   Socioeconomic History   Marital status: Widowed    Spouse name: Not on file   Number of children: 2   Years of education: 14   Highest education level: Some college, no degree  Occupational History   Occupation: Delivery    Comment: Part-time  Tobacco Use   Smoking status: Former   Smokeless tobacco: Never  Scientific laboratory technician Use: Never used  Substance and Sexual Activity   Alcohol use: Yes    Comment:  rarely   Drug use: No   Sexual activity: Not on file  Other Topics Concern   Not on file  Social History Narrative   Lives alone. He has two children. He enjoys cooking and bowling.   Social Determinants of Health   Financial Resource Strain: Low Risk  (05/29/2022)   Overall Financial Resource Strain (CARDIA)    Difficulty of Paying Living Expenses: Not hard at all  Food Insecurity: No Food Insecurity (05/29/2022)   Hunger Vital Sign    Worried About Running Out of Food in the Last Year: Never true    Ran Out of Food in the Last Year: Never true  Transportation Needs: No Transportation Needs (05/29/2022)   PRAPARE - Hydrologist (Medical): No    Lack of Transportation (Non-Medical): No  Physical Activity: Sufficiently Active (05/29/2022)   Exercise Vital Sign    Days of Exercise per Week: 5 days    Minutes of Exercise per Session: 40 min  Stress: No Stress Concern Present (05/29/2022)   [place]    Feeling of Stress : Only a little  Social Connections: Socially Isolated (05/29/2022)   Social Connection and Isolation Panel [NHANES]    Frequency of Communication with Friends and Family: More than three times a week    Frequency of Social Gatherings with Friends and Family: More than three times a week    Attends Religious Services: Never    Marine scientist or Organizations: No    Attends Archivist Meetings: Never    Marital Status: Widowed    Activities of Daily Living    05/29/2022    1:19 PM  In your present state of health, do you have any difficulty performing the following activities:  Hearing? 0  Vision? 0  Difficulty concentrating or making decisions? 0  Walking or climbing stairs? 0  Dressing or bathing? 0  Doing errands, shopping? 0  Preparing Food and eating ? N  Using the Toilet? N  In the past six months, have you accidently leaked urine? N  Do you  have problems with loss of bowel control? N  Managing your Medications? N  Managing your Finances? N  Housekeeping or managing your Housekeeping? N    Patient Education/ Literacy How often do you need to have someone help you when you read instructions, pamphlets, or other written materials from your doctor or pharmacy?: 1 - Never What is the last grade level you completed in school?: Two years of college  Exercise Current Exercise Habits: Home exercise routine, Type of exercise: walking, Time (Minutes): 40, Frequency (Times/Week): 5, Weekly Exercise (  Minutes/Week): 200, Intensity: Moderate, Exercise limited by: None identified  Diet Patient reports consuming  2-3  meals a day and 1 snack(s) a day Patient reports that his primary diet is: Regular Patient reports that she does have regular access to food.   Depression Screen    05/29/2022    1:13 PM 05/18/2022    2:29 PM 05/15/2021    4:26 PM  PHQ 2/9 Scores  PHQ - 2 Score 0 4 2  PHQ- 9 Score 0 7 6     Fall Risk    05/29/2022    1:13 PM 05/18/2022    2:29 PM 05/15/2021    4:12 PM  Fall Risk   Falls in the past year? 0 0 0  Number falls in past yr: 0 0 0  Injury with Fall? 0 0 0  Risk for fall due to : No Fall Risks No Fall Risks No Fall Risks  Follow up Falls evaluation completed Falls evaluation completed Falls evaluation completed     Objective:  Jamie Rice seemed alert and oriented and he participated appropriately during our telephone visit.  Blood Pressure Weight BMI  BP Readings from Last 3 Encounters:  05/18/22 132/74  02/16/22 122/76  11/18/21 115/73   Wt Readings from Last 3 Encounters:  05/18/22 187 lb (84.8 kg)  02/16/22 189 lb (85.7 kg)  11/18/21 200 lb (90.7 kg)   BMI Readings from Last 1 Encounters:  05/18/22 26.08 kg/m    *Unable to obtain current vital signs, weight, and BMI due to telephone visit type  Hearing/Vision  Jamie Rice did not seem to have difficulty with hearing/understanding during  the telephone conversation Reports that he has had a formal eye exam by an eye care professional within the past year Reports that he has not had a formal hearing evaluation within the past year *Unable to fully assess hearing and vision during telephone visit type  Cognitive Function:    05/29/2022    1:21 PM  6CIT Screen  What Year? 0 points  What month? 0 points  What time? 0 points  Count back from 20 0 points  Months in reverse 0 points  Repeat phrase 0 points  Total Score 0 points   (Normal:0-7, Significant for Dysfunction: >8)  Normal Cognitive Function Screening: Yes   Immunization & Health Maintenance Record Immunization History  Administered Date(s) Administered   Fluad Quad(high Dose 65+) 07/29/2021   Influenza,inj,Quad PF,6+ Mos 08/02/2017   PFIZER(Purple Top)SARS-COV-2 Vaccination 12/20/2019, 01/10/2020, 07/27/2020   Pfizer Covid-19 Vaccine Bivalent Booster 74yr & up 07/29/2021   Tdap 05/28/2018   Zoster Recombinat (Shingrix) 03/19/2022, 05/11/2022    Health Maintenance  Topic Date Due   OPHTHALMOLOGY EXAM  05/29/2022 (Originally 02/26/1966)   Diabetic kidney evaluation - Urine ACR  05/30/2022 (Originally 02/26/1974)   FOOT EXAM  05/30/2022 (Originally 02/26/1966)   COVID-19 Vaccine (5 - Pfizer series) 06/03/2022 (Originally 11/29/2021)   Pneumonia Vaccine 66 Years old (1 - PCV) 11/18/2022 (Originally 02/26/2021)   INFLUENZA VACCINE  01/10/2023 (Originally 05/12/2022)   COLONOSCOPY (Pts 45-411yrInsurance coverage will need to be confirmed)  05/19/2023 (Originally 02/26/2001)   Hepatitis C Screening  05/19/2023 (Originally 02/26/1974)   Diabetic kidney evaluation - GFR measurement  11/18/2022   HEMOGLOBIN A1C  11/18/2022   TETANUS/TDAP  05/28/2028   Zoster Vaccines- Shingrix  Completed   HPV VACCINES  Aged Out       Assessment  This is a routine wellness examination for Jamie Rice Health Maintenance:  Due or Overdue There are no preventive care  reminders to display for this patient.   Jamie Rice does not need a referral for [employer] Assistance: Care Management:   no Social Work:    no Prescription Assistance:  no Nutrition/Diabetes Education:  no   Plan:  Personalized Goals  Goals Addressed               This Visit's Progress     Patient Stated (pt-stated)        05/29/2022 AWV Goal: Diabetes Management  Patient will maintain an A1C level below 6.0 Patient will not develop any diabetic foot complications Patient will not experience any hypoglycemic episodes over the next 3 months Patient will notify our office of any CBG readings outside of the provider recommended range by calling [phone] Patient will adhere to provider recommendations for diabetes management  Patient Self Management Activities take all medications as prescribed and report any negative side effects monitor and record blood sugar readings as directed adhere to a low carbohydrate diet that incorporates lean proteins, vegetables, whole grains, low glycemic fruits check feet daily noting any sores, cracks, injuries, or callous formations see PCP or podiatrist if he notices any changes in his legs, feet, or toenails Patient will visit PCP and have an A1C level checked every 3 to 6 months as directed  have a yearly eye exam to monitor for vascular changes associated with diabetes and will request that the report be sent to his pcp.  consult with his PCP regarding any changes in his health or new or worsening symptoms        Personalized Health Maintenance & Screening Recommendations  Pneumococcal vaccine  Influenza vaccine Colorectal cancer screening Foot exam Urine ACR Eye exam  Lung Cancer Screening Recommended: no (Low Dose CT Chest recommended if Age 36-80 years, 30 pack-year currently smoking OR have quit w/in past 15 years) Hepatitis C Screening recommended: yes HIV Screening recommended: no  Advanced Directives: Written  information was not prepared per patient's request.  Referrals & Orders Orders Placed This Encounter  Procedures   Ambulatory referral to Gastroenterology (for Colonoscopy)    Follow-up Plan Follow-up with Luetta Nutting, DO as planned Foot exam and urine ACR can be done at your next visit.  Flu shot can be done at the pharmacy or our office. Please schedule your eye exam with diabetic eye screening. Discuss pneumonia shot with PCP at your next appointment.  Colonoscopy referral has been sent and they will call you to schedule. Medicare wellness visit in one year. AVS printed and mailed to the patient.   I have personally reviewed and noted the following in the patient's chart:   Medical and social history Use of alcohol, tobacco or illicit drugs  Current medications and supplements Functional ability and status Nutritional status Physical activity Advanced directives List of other physicians Hospitalizations, surgeries, and ER visits in previous 12 months Vitals Screenings to include cognitive, depression, and falls Referrals and appointments  In addition, I have reviewed and discussed with Jamie Rice certain preventive protocols, quality metrics, and best practice recommendations. A written personalized care plan for preventive services as well as general preventive health recommendations is available and can be mailed to the patient at his request.      Tinnie Gens, RN BSN  05/29/2022

## 2022-08-07 ENCOUNTER — Telehealth: Payer: Self-pay | Admitting: *Deleted

## 2022-08-07 NOTE — Telephone Encounter (Signed)
Pt is agreeable to plan of care for tele pre op appt 08/24/22 @ 2:40. Med rec and consent are done.

## 2022-08-07 NOTE — Telephone Encounter (Signed)
Pt is agreeable to plan of care for tele pre op appt 08/24/22 @ 2:40. Med rec and consent are done.     Patient Consent for Virtual Visit        Jamie Rice has provided verbal consent on 08/07/2022 for a virtual visit (video or telephone).   CONSENT FOR VIRTUAL VISIT FOR:  Jamie Rice  By participating in this virtual visit I agree to the following:  I hereby voluntarily request, consent and authorize [facility] and its employed or contracted physicians, physician assistants, nurse practitioners or other licensed health care professionals (the Practitioner), to provide me with telemedicine health care services (the "Services") as deemed necessary by the treating Practitioner. I acknowledge and consent to receive the Services by the Practitioner via telemedicine. I understand that the telemedicine visit will involve communicating with the Practitioner through live audiovisual communication technology and the disclosure of certain medical information by electronic transmission. I acknowledge that I have been given the opportunity to request an in-person assessment or other available alternative prior to the telemedicine visit and am voluntarily participating in the telemedicine visit.  I understand that I have the right to withhold or withdraw my consent to the use of telemedicine in the course of my care at any time, without affecting my right to future care or treatment, and that the Practitioner or I may terminate the telemedicine visit at any time. I understand that I have the right to inspect all information obtained and/or recorded in the course of the telemedicine visit and may receive copies of available information for a reasonable fee.  I understand that some of the potential risks of receiving the Services via telemedicine include:  Delay or interruption in medical evaluation due to technological equipment failure or disruption; Information transmitted may not be  sufficient (e.g. poor resolution of images) to allow for appropriate medical decision making by the Practitioner; and/or  In rare instances, security protocols could fail, causing a breach of personal health information.  Furthermore, I acknowledge that it is my responsibility to provide information about my medical history, conditions and care that is complete and accurate to the best of my ability. I acknowledge that Practitioner's advice, recommendations, and/or decision may be based on factors not within their control, such as incomplete or inaccurate data provided by me or distortions of diagnostic images or specimens that may result from electronic transmissions. I understand that the practice of medicine is not an exact science and that Practitioner makes no warranties or guarantees regarding treatment outcomes. I acknowledge that a copy of this consent can be made available to me via my patient portal ([facility]), or I can request a printed copy by calling the office of [facility].    I understand that my insurance will be billed for this visit.   I have read or had this consent read to me. I understand the contents of this consent, which adequately explains the benefits and risks of the Services being provided via telemedicine.  I have been provided ample opportunity to ask questions regarding this consent and the Services and have had my questions answered to my satisfaction. I give my informed consent for the services to be provided through the use of telemedicine in my medical care

## 2022-08-07 NOTE — Telephone Encounter (Signed)
   Name: [name]  DOB: [birth year]  MRN: [id number]  Primary Cardiologist: Larae Grooms, MD  Chart reviewed as part of pre-operative protocol coverage. Because of Jamie Rice's past medical history and time since last visit, he will require a follow-up phone visit in order to better assess preoperative cardiovascular risk.  Will route to preop callback team for assistance in scheduling.   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 Per office protocol may hold Plavix 5 days prior to planned procedure.   Loel Dubonnet, NP  08/07/2022, 3:32 PM

## 2022-08-07 NOTE — Telephone Encounter (Signed)
   Pre-operative Risk Assessment    Patient Name: Jamie Rice  DOB: 1956/06/10 MRN: [id number]      Request for Surgical Clearance    Procedure:   COLONOSCOPY  Date of Surgery:  Clearance 09/07/22                                 Surgeon: DR. Shary Key Surgeon's Group or Practice Name:  [facility] Phone number:  [phone] ATTN: [place], [place] Fax number:  [phone]   Type of Clearance Requested:   - Medical  - Pharmacy:  Hold Clopidogrel (Plavix) x 4 DAYS PRIOR   Type of Anesthesia:   PROPOFOL   Additional requests/questions:    [name]   08/07/2022, 2:34 PM

## 2022-08-12 ENCOUNTER — Other Ambulatory Visit: Payer: Self-pay | Admitting: Family Medicine

## 2022-08-13 LAB — HM DIABETES EYE EXAM

## 2022-08-24 ENCOUNTER — Ambulatory Visit: Payer: PPO | Attending: Cardiovascular Disease | Admitting: Physician Assistant

## 2022-08-24 DIAGNOSIS — Z01810 Encounter for preprocedural cardiovascular examination: Secondary | ICD-10-CM

## 2022-08-24 NOTE — Progress Notes (Signed)
Virtual Visit via Telephone Note   Because of Jamie Rice's co-morbid illnesses, he is at least at moderate risk for complications without adequate follow up.  This format is felt to be most appropriate for this patient at this time.  The patient did not have access to video technology/had technical difficulties with video requiring transitioning to audio format only (telephone).  All issues noted in this document were discussed and addressed.  No physical exam could be performed with this format.  Please refer to the patient's chart for his consent to telehealth for [facility].  Evaluation Performed:  Preoperative cardiovascular risk assessment _____________   Date:  08/24/2022   Patient ID:  Jamie Rice, DOB 1955-10-30, MRN [id number] Patient Location:  Home Provider location:   Office  Primary Care Provider:  Luetta Nutting, DO Primary Cardiologist:  Larae Grooms, MD  Chief Complaint / Patient Profile   66 y.o. y/o male with a h/o anxiety, arthritis, back pain, CAD s/p DES to RCA 2014, depression, hypertension, hyperlipidemia who is pending colonoscopy is scheduled for 09/07/2022 and presents today for telephonic preoperative cardiovascular risk assessment.  Past Medical History    Past Medical History:  Diagnosis Date   Anxiety    Arthritis    Back pain    CAD (coronary artery disease)    Depression    HTN (hypertension)    Hypercholesteremia    Past Surgical History:  Procedure Laterality Date   HERNIA REPAIR     PTCA with stent - [facility]     VASECTOMY      Allergies  No Known Allergies  History of Present Illness    Jamie Rice is a 67 y.o. male who presents via audio/video conferencing for a telehealth visit today.  Pt was last seen in cardiology clinic on 02/16/2022 by Dr. Irish Lack  At that time Jamie Rice was doing well .  The patient is now pending procedure as outlined above. Since his last visit, he has not had  any issues from a cardiac standpoint.  He denies chest pain, shortness of breath, palpitations.  He is very active and has no issues with walking, running, or doing any indoor or outdoor housework.  For this reason he scored an 8.27 on the DASI.  He also enjoys swimming and golfing.  The score well exceeds the 4 METS minimum requirement.  He is aware that he is to stop his Plavix 5 days prior to the procedure.  He is not currently on any baby aspirin.  He may resume his Plavix when medically safe to do so.   Home Medications    Prior to Admission medications   Medication Sig Start Date End Date Taking? Authorizing Provider  atorvastatin (LIPITOR) 80 MG tablet TAKE ONE TABLET BY MOUTH ONE TIME DAILY 05/11/22   Jettie Booze, MD  Cholecalciferol (D3-1000) 25 MCG (1000 UT) capsule Take 2,000 Units by mouth daily.    [provider]  Cinnamon 500 MG capsule Take 500 mg by mouth 2 (two) times daily.    [provider]  clopidogrel (PLAVIX) 75 MG tablet TAKE ONE TABLET BY MOUTH ONE TIME DAILY 05/11/22   Jettie Booze, MD  enalapril (VASOTEC) 10 MG tablet TAKE ONE TABLET BY MOUTH ONE TIME DAILY 05/11/22   Jettie Booze, MD  nitroGLYCERIN (NITROSTAT) 0.4 MG SL tablet Place 1 tablet (0.4 mg total) under the tongue every 5 (five) minutes as needed for chest pain. 02/16/22  Jettie Booze, MD  NON FORMULARY Take 1 capsule by mouth every morning. TUMERIC    [provider]  Omega-3 Fatty Acids (FISH OIL PO) Take 1,200 mg by mouth 2 (two) times daily.     [provider]  XIGDUO XR 07-999 MG TB24 TAKE ONE TABLET BY MOUTH ONE TIME DAILY 08/12/22   Luetta Nutting, DO    Physical Exam    Vital Signs:  Jamie Rice does not have vital signs available for review today.  Given telephonic nature of communication, physical exam is limited. AAOx3. NAD. Normal affect.  Speech and respirations are unlabored.  Accessory Clinical Findings     None  Assessment & Plan    1.  Preoperative Cardiovascular Risk Assessment:  Jamie Rice perioperative risk of a major cardiac event is 0.9% according to the Revised Cardiac Risk Index (RCRI).  Therefore, he is at low risk for perioperative complications.   His functional capacity is excellent at 8.27 METs according to the [place] Activity Status Index (DASI). Recommendations: According to ACC/AHA guidelines, no further cardiovascular testing needed.  The patient may proceed to surgery at acceptable risk.   Antiplatelet and/or Anticoagulation Recommendations: Clopidogrel (Plavix) can be held for 5 days prior to his surgery and resumed as soon as possible post op.   A copy of this note will be routed to requesting surgeon.  Time:   Today, I have spent 5 minutes with the patient with telehealth technology discussing medical history, symptoms, and management plan.     Elgie Collard, PA-C  08/24/2022, 2:55 PM

## 2022-08-24 NOTE — Telephone Encounter (Signed)
Our office received another duplicate request stating 3rd attempt. Requesting office was sent an FYI note the pt has a televisit today at 2:40.   Shirlee Limerick, CMA with Dr. Shary Key; if we may ask to please not send duplicate request as this sometimes can cause multiple request for the same procedure in the pre op provider in basket.   Once the pt has been cleared we will fax the notes to the requesting office. We do appreciate the time and effort in this matter.

## 2022-09-01 ENCOUNTER — Telehealth: Payer: Self-pay | Admitting: Family Medicine

## 2022-09-01 ENCOUNTER — Telehealth: Payer: Self-pay

## 2022-09-01 NOTE — Telephone Encounter (Signed)
I was alerted that pt called reporting concerning s/s. Called pt to discuss. He reports that his R knee has become very TTP over the last view days, w/ a "knot" just distal to his R knee. Pt does recall that he was making several trips up and down a ladder. No wounds. No redness. Pt feels like the R knee is "warm," but he himself does not have a fever. Pt advised to cont to monitor and we would take a look at it tomorrow at his visit. Advised if anything worsened significantly, he should proceed to the ED or UC. Pt verbalized understanding.

## 2022-09-01 NOTE — Telephone Encounter (Signed)
Called pt and discussed. See encounter.

## 2022-09-01 NOTE — Telephone Encounter (Signed)
Patient called asking to schedule an appointment for right knee pain. He said that it feels like there is a knot on it and the pain goes from his knee down to his ankle.  He mentioned that the calf area feel feverish.  Patient is scheduled tomorrow at 2:45 with Dr Georgina Snell.  Could you call him to triage prior to appointment?

## 2022-09-02 ENCOUNTER — Ambulatory Visit (INDEPENDENT_AMBULATORY_CARE_PROVIDER_SITE_OTHER): Payer: PPO | Admitting: Family Medicine

## 2022-09-02 ENCOUNTER — Ambulatory Visit: Payer: Self-pay

## 2022-09-02 VITALS — BP 122/78 | HR 63 | Ht 71.0 in | Wt 191.0 lb

## 2022-09-02 DIAGNOSIS — M25561 Pain in right knee: Secondary | ICD-10-CM | POA: Diagnosis not present

## 2022-09-02 DIAGNOSIS — G8929 Other chronic pain: Secondary | ICD-10-CM | POA: Diagnosis not present

## 2022-09-02 DIAGNOSIS — M7041 Prepatellar bursitis, right knee: Secondary | ICD-10-CM

## 2022-09-02 MED ORDER — HYDROCODONE-ACETAMINOPHEN 5-325 MG PO TABS: 1.0000 | ORAL_TABLET | Freq: Four times a day (QID) | ORAL | 0 refills | Status: DC | PRN

## 2022-09-02 NOTE — Patient Instructions (Addendum)
Thank you for coming in today.   Use compression   Recheck in 1 month with myself or Dr Tamala Julian.   Please get an Xray today before you leave

## 2022-09-02 NOTE — Progress Notes (Signed)
I, Jamie Rice, LAT, ATC acting as a scribe for Jamie Leader, MD.  Jamie Rice is a 66 y.o. male who presents to [place] at [facility] today for R knee pain. Pt was previously seen by Dr. Tamala Rice on 10/17/20 for chronic R knee pain, R hamstring strain, and R calf pain. Today, pt reports he has been just a 44f ladder some lately, but that's the only thing he can think of that may have caused his R knee pain. Pt reports R knee pain started over the weekend and then significantly worsened over the past 2 days. Pt note that pain was so bad he couldn't sleep last night. Pt locates pain to the anterior aspect of the R knee. No unusual changes in his diet.    R Knee swelling: yes Mechanical symptoms: no Radiates: yes- into anterior shin Aggravates: rest, TTP,  Treatments tried: Tylenol, IBU  Dx imaging: 05/30/19 R knee MRI  08/23/18 R knee MRI  08/22/18 R knee XR  Pertinent review of systems: No fevers or chills  Relevant historical information: Diabetes   Exam:  BP 122/78   Pulse 63   Ht '5\' 11"'$  (1.803 m)   Wt 191 lb (86.6 kg)   SpO2 96%   BMI 26.64 kg/m  General: Well Developed, well nourished, and in no acute distress.   MSK: Right knee swelling visible at anterior knee overlying distal patellar tendon.  Otherwise knee is normal-appearing Normal knee motion pain with flexion. Stable ligamentous exam although guarding is present with exam testing. Intact strength pain with resisted knee extension. Pulses capillary fill and sensation are intact distally.    Lab and Radiology Results  Procedure: Real-time Ultrasound Guided Injection of right knee prepatellar bursa Device: Philips Affiniti 50G Images permanently stored and available for review in PACS Ultrasound evaluation prior to injection shows moderate prepatellar bursitis.  Degenerative changes are present.  Intact patellar and quad tendons.  No significant Baker's cyst. Verbal informed consent  obtained.  Discussed risks and benefits of procedure. Warned about infection, bleeding, hyperglycemia damage to structures among others. Patient expresses understanding and agreement Time-out conducted.   Noted no overlying erythema, induration, or other signs of local infection.   Skin prepped in a sterile fashion.   Local anesthesia: Topical Ethyl chloride.   With sterile technique and under real time ultrasound guidance: 30 mg of Kenalog and 0.75 mL Marcaine injected into prepatellar bursa. Fluid seen entering the bursa.   Completed without difficulty   Pain immediately resolved suggesting accurate placement of the medication.   Advised to call if fevers/chills, erythema, induration, drainage, or persistent bleeding.   Images permanently stored and available for review in the ultrasound unit.  Impression: Technically successful ultrasound guided injection.   X-ray right images right knee ordered today but [name] not get it on his way out.   Assessment and Plan: 66y.o. male with severe right knee pain.  This is an acute exacerbation of a chronic problem.  Pain thought to be due to prepatellar bursitis.  The etiology or cause of this is somewhat unclear.  He has been up and down a ladder a lot recently which may be the underlying cause.  Plan for steroid injection to the prepatellar bursa given the severity of his pain.  Also prescribed hydrocodone his pain is not controlled with Tylenol and ibuprofen.  Recommend compression.  Recheck in 1 month with myself or Dr. STamala Rice   PDMP reviewed during this encounter. Orders  Placed This Encounter  Procedures   [place] LIMITED JOINT SPACE STRUCTURES LOW RIGHT(NO LINKED CHARGES)    Order Specific Question:   Reason for Exam (SYMPTOM  OR DIAGNOSIS REQUIRED)    Answer:   right knee pain    Order Specific Question:   Preferred imaging location?    Answer:   [place]   DG Knee AP/LAT W/[place] Right    Standing Status:    Future    Standing Expiration Date:   09/03/2023    Order Specific Question:   Reason for Exam (SYMPTOM  OR DIAGNOSIS REQUIRED)    Answer:   eval knee pain    Order Specific Question:   Preferred imaging location?    Answer:   [facility]   Meds ordered this encounter  Medications   HYDROcodone-acetaminophen (NORCO/VICODIN) 5-325 MG tablet    Sig: Take 1 tablet by mouth every 6 (six) hours as needed.    Dispense:  15 tablet    Refill:  0     Discussed warning signs or symptoms. Please see discharge instructions. Patient expresses understanding.   The above documentation has been reviewed and is accurate and complete Jamie Rice, M.D.

## 2022-09-16 ENCOUNTER — Encounter: Payer: Self-pay | Admitting: Family Medicine

## 2022-10-02 ENCOUNTER — Other Ambulatory Visit: Payer: Self-pay | Admitting: Family Medicine

## 2022-10-02 NOTE — Progress Notes (Deleted)
   I, Peterson Lombard, LAT, ATC acting as a scribe for Lynne Leader, MD.  Jamie Rice is a 66 y.o. male who presents to [name] at [facility] today for f/u R knee pain thought to be due to prepatellar bursitis. Pt was last seen by Dr. Georgina Snell on 09/02/22 and was prescribed hydrocodone, advised to use compression, and was given a prepatellar bursa steroid injection. Today, pt reports  Dx imaging: 05/30/19 R knee MRI             08/23/18 R knee MRI             08/22/18 R knee XR  Pertinent review of systems: ***  Relevant historical information: ***   Exam:  There were no vitals taken for this visit. General: Well Developed, well nourished, and in no acute distress.   MSK: ***    Lab and Radiology Results No results found for this or any previous visit (from the past 72 hour(s)). No results found.     Assessment and Plan: 66 y.o. male with ***   PDMP not reviewed this encounter. No orders of the defined types were placed in this encounter.  No orders of the defined types were placed in this encounter.    Discussed warning signs or symptoms. Please see discharge instructions. Patient expresses understanding.   ***

## 2022-10-06 ENCOUNTER — Ambulatory Visit: Payer: PPO | Admitting: Family Medicine

## 2022-11-18 ENCOUNTER — Ambulatory Visit (INDEPENDENT_AMBULATORY_CARE_PROVIDER_SITE_OTHER): Payer: PPO | Admitting: Family Medicine

## 2022-11-18 ENCOUNTER — Encounter: Payer: Self-pay | Admitting: Family Medicine

## 2022-11-18 ENCOUNTER — Ambulatory Visit (INDEPENDENT_AMBULATORY_CARE_PROVIDER_SITE_OTHER): Payer: PPO

## 2022-11-18 VITALS — BP 118/74 | HR 66 | Ht 72.0 in | Wt 192.8 lb

## 2022-11-18 DIAGNOSIS — M79661 Pain in right lower leg: Secondary | ICD-10-CM | POA: Insufficient documentation

## 2022-11-18 DIAGNOSIS — E782 Mixed hyperlipidemia: Secondary | ICD-10-CM

## 2022-11-18 DIAGNOSIS — I10 Essential (primary) hypertension: Secondary | ICD-10-CM

## 2022-11-18 DIAGNOSIS — E1169 Type 2 diabetes mellitus with other specified complication: Secondary | ICD-10-CM | POA: Diagnosis not present

## 2022-11-18 DIAGNOSIS — M79604 Pain in right leg: Secondary | ICD-10-CM | POA: Diagnosis not present

## 2022-11-18 NOTE — Assessment & Plan Note (Signed)
Updated labs including A1c ordered.  Continue Xigduo at current strength.

## 2022-11-18 NOTE — Assessment & Plan Note (Signed)
Blood pressure remains well-controlled.  Continue enalapril at current strength.

## 2022-11-18 NOTE — Progress Notes (Signed)
Jamie Rice - 67 y.o. male MRN [id number]  Date of birth: [birth year]  Subjective Chief Complaint  Patient presents with   Follow-up    HPI Jamie Rice is a 67 year old male here today for follow-up visit.  Reports he continues to have some stress.  His father passed away in 10-09-2023 and he is still dealing with settlement of his estate.  Overall feels like he is handling this okay.  Continues on enalapril for management of hypertension.  His blood pressure has been well-controlled with this.  He denies side effects from medication and has not had any symptoms related to hypertension including chest pain, shortness of breath, palpitations, headaches or vision changes.  Diabetes had improved at previous visit.  He has continued on Xigduo XR.  Tolerating this well.  He has not been as consistent with diet recently.  He does not monitor his blood sugars at home.  He has had some pain in the right knee.  Noted some swelling in the back of the knee as well as into the calf with some pain in the calf.  He has had a Baker's cyst previously.  No history of DVT.  ROS:  A comprehensive ROS was completed and negative except as noted per HPI  No Known Allergies  Past Medical History:  Diagnosis Date   Anxiety    Arthritis    Back pain    CAD (coronary artery disease)    Depression    HTN (hypertension)    Hypercholesteremia     Past Surgical History:  Procedure Laterality Date   HERNIA REPAIR     PTCA with stent - [facility]     VASECTOMY      Social History   Socioeconomic History   Marital status: Widowed    Spouse name: Not on file   Number of children: 2   Years of education: 14   Highest education level: Some college, no degree  Occupational History   Occupation: Delivery    Comment: Part-time  Tobacco Use   Smoking status: Former   Smokeless tobacco: Never  Scientific laboratory technician Use: Never used  Substance and Sexual Activity   Alcohol use: Yes     Comment: rarely   Drug use: No   Sexual activity: Not on file  Other Topics Concern   Not on file  Social History Narrative   Lives alone. He has two children. He enjoys cooking and bowling.   Social Determinants of Health   Financial Resource Strain: Low Risk  (05/29/2022)   Overall Financial Resource Strain (CARDIA)    Difficulty of Paying Living Expenses: Not hard at all  Food Insecurity: No Food Insecurity (05/29/2022)   Hunger Vital Sign    Worried About Running Out of Food in the Last Year: Never true    Ran Out of Food in the Last Year: Never true  Transportation Needs: No Transportation Needs (05/29/2022)   PRAPARE - Hydrologist (Medical): No    Lack of Transportation (Non-Medical): No  Physical Activity: Sufficiently Active (05/29/2022)   Exercise Vital Sign    Days of Exercise per Week: 5 days    Minutes of Exercise per Session: 40 min  Stress: No Stress Concern Present (05/29/2022)   [place]    Feeling of Stress : Only a little  Social Connections: Socially Isolated (05/29/2022)   Social Connection and Isolation Panel [NHANES]  Frequency of Communication with Friends and Family: More than three times a week    Frequency of Social Gatherings with Friends and Family: More than three times a week    Attends Religious Services: Never    Marine scientist or Organizations: No    Attends Archivist Meetings: Never    Marital Status: Widowed    Family History  Problem Relation Age of Onset   Heart disease Father    Heart attack Father    Hypertension Father    Stroke Father        X4   Hypertension Paternal Uncle        X4   Stroke Paternal Uncle     Health Maintenance  Topic Date Due   Diabetic kidney evaluation - Urine ACR  Never done   Diabetic kidney evaluation - eGFR measurement  11/18/2022   HEMOGLOBIN A1C  11/18/2022   Pneumonia Vaccine 61+  Years old (1 - PCV) 11/18/2022 (Originally 02/26/2021)   COVID-19 Vaccine (5 - 2023-24 season) 12/04/2022 (Originally 06/12/2022)   INFLUENZA VACCINE  01/10/2023 (Originally 05/12/2022)   FOOT EXAM  02/10/2023 (Originally 02/26/1966)   COLONOSCOPY (Pts 45-14yr Insurance coverage will need to be confirmed)  05/19/2023 (Originally 02/26/2001)   Hepatitis C Screening  05/19/2023 (Originally 02/26/1974)   Medicare Annual Wellness (AWV)  05/30/2023   OPHTHALMOLOGY EXAM  08/14/2023   DTaP/Tdap/Td (2 - Td or Tdap) 05/28/2028   Zoster Vaccines- Shingrix  Completed   HPV VACCINES  Aged Out     ----------------------------------------------------------------------------------------------------------------------------------------------------------------------------------------------------------------- Physical Exam BP 118/74 (BP Location: Left Arm, Patient Position: Sitting, Cuff Size: Normal)   Pulse 66   Ht 6' (1.829 m)   Wt 192 lb 12.8 oz (87.5 kg)   SpO2 96%   BMI 26.15 kg/m   Physical Exam Constitutional:      Appearance: Normal appearance.  HENT:     Head: Normocephalic and atraumatic.  Eyes:     General: No scleral icterus. Cardiovascular:     Rate and Rhythm: Normal rate and regular rhythm.  Pulmonary:     Effort: Pulmonary effort is normal.     Breath sounds: Normal breath sounds.  Musculoskeletal:     Cervical back: Neck supple.     Comments: Right-sided popliteal fullness noted.  Tenderness in the calf.  Positive Homans' sign.  Neurological:     Mental Status: He is alert.     ------------------------------------------------------------------------------------------------------------------------------------------------------------------------------------------------------------------- Assessment and Plan  Right calf pain He does have history of Baker's cyst on the right side and popliteal fullness could be recurrent to this.  I am concerned that he is having increased pain in  his calf as well.  Sending for stat ultrasound to rule out DVT.  If negative for DVT we discussed having him see Dr. TDianah Fieldfor his knee pain.  HTN (hypertension) Blood pressure remains well-controlled.  Continue enalapril at current strength.  Type 2 diabetes mellitus ([place] Updated labs including A1c ordered.  Continue Xigduo at current strength.   No orders of the defined types were placed in this encounter.   No follow-ups on file.    This visit occurred during the SARS-CoV-2 public health emergency.  Safety protocols were in place, including screening questions prior to the visit, additional usage of staff PPE, and extensive cleaning of exam room while observing appropriate contact time as indicated for disinfecting solutions.

## 2022-11-18 NOTE — Patient Instructions (Signed)
We'll be in touch with ultrasound results and recommendations.   Return to have labs when fasting.

## 2022-11-18 NOTE — Assessment & Plan Note (Signed)
He does have history of Baker's cyst on the right side and popliteal fullness could be recurrent to this.  I am concerned that he is having increased pain in his calf as well.  Sending for stat ultrasound to rule out DVT.  If negative for DVT we discussed having him see Dr. Dianah Field for his knee pain.

## 2022-11-23 ENCOUNTER — Ambulatory Visit (INDEPENDENT_AMBULATORY_CARE_PROVIDER_SITE_OTHER): Payer: PPO

## 2022-11-23 ENCOUNTER — Other Ambulatory Visit: Payer: Self-pay | Admitting: Sports Medicine

## 2022-11-23 ENCOUNTER — Ambulatory Visit (INDEPENDENT_AMBULATORY_CARE_PROVIDER_SITE_OTHER): Payer: PPO | Admitting: Sports Medicine

## 2022-11-23 ENCOUNTER — Encounter: Payer: Self-pay | Admitting: Sports Medicine

## 2022-11-23 DIAGNOSIS — M1711 Unilateral primary osteoarthritis, right knee: Secondary | ICD-10-CM

## 2022-11-23 DIAGNOSIS — M7071 Other bursitis of hip, right hip: Secondary | ICD-10-CM | POA: Diagnosis not present

## 2022-11-23 DIAGNOSIS — M1611 Unilateral primary osteoarthritis, right hip: Secondary | ICD-10-CM | POA: Diagnosis not present

## 2022-11-23 DIAGNOSIS — M25551 Pain in right hip: Secondary | ICD-10-CM

## 2022-11-23 DIAGNOSIS — Z09 Encounter for follow-up examination after completed treatment for conditions other than malignant neoplasm: Secondary | ICD-10-CM | POA: Diagnosis not present

## 2022-11-23 DIAGNOSIS — M1712 Unilateral primary osteoarthritis, left knee: Secondary | ICD-10-CM | POA: Diagnosis not present

## 2022-11-23 DIAGNOSIS — G8929 Other chronic pain: Secondary | ICD-10-CM

## 2022-11-23 DIAGNOSIS — M25561 Pain in right knee: Secondary | ICD-10-CM

## 2022-11-23 MED ORDER — HYDROCODONE-ACETAMINOPHEN 5-325 MG PO TABS: 1.0000 | ORAL_TABLET | Freq: Four times a day (QID) | ORAL | 0 refills | Status: DC | PRN

## 2022-11-23 MED ORDER — PREDNISONE 50 MG PO TABS: ORAL_TABLET | ORAL | 0 refills | Status: DC

## 2022-11-23 MED ORDER — TRIAMCINOLONE ACETONIDE 40 MG/ML IJ SUSP
40.0000 mg | Freq: Once | INTRAMUSCULAR | Status: AC
  Administered 2022-11-23: 40 mg via INTRAMUSCULAR

## 2022-11-23 NOTE — Progress Notes (Signed)
    Procedures performed today:    Procedure: Real-time Ultrasound Guided injection of the right knee Device: Samsung HS60  Verbal informed consent obtained.  Time-out conducted.  Noted no overlying erythema, induration, or other signs of local infection.  Skin prepped in a sterile fashion.  Local anesthesia: Topical Ethyl chloride.  With sterile technique and under real time ultrasound guidance: Trace effusion noted, 1 cc Kenalog 40, 2 cc lidocaine, 2 cc bupivacaine injected easily Completed without difficulty  Advised to call if fevers/chills, erythema, induration, drainage, or persistent bleeding.  Images permanently stored and available for review in PACS.  Impression: Technically successful ultrasound guided injection.  Independent interpretation of notes and tests performed by another provider:   None.  Brief History, Exam, Impression, and Recommendations:    Degenerative arthritis of right knee History of degenerative meniscal tear, osteoarthritis, increasing pain with a Baker's cyst, we discussed the anatomy and pathophysiology, I injected the knee today, we will get x-rays and formal PT. Return to see me in 6 weeks.  Ischial bursitis of right side Increasing pain right buttock, reproducible with palpation of the ischial tuberosity. Also reproducible with resisted flexion of the knee, adding physical therapy, prednisone, x-rays, return to see me in 6 weeks, injection if not better.    ____________________________________________ Gwen Her. Dianah Field, M.D., ABFM., CAQSM., AME. Primary Care and Sports Medicine [facility]  Adjunct Professor of [place] [employer] [facility] of Medicine  Risk manager

## 2022-11-23 NOTE — Assessment & Plan Note (Signed)
Increasing pain right buttock, reproducible with palpation of the ischial tuberosity. Also reproducible with resisted flexion of the knee, adding physical therapy, prednisone, x-rays, return to see me in 6 weeks, injection if not better.

## 2022-11-23 NOTE — Assessment & Plan Note (Signed)
History of degenerative meniscal tear, osteoarthritis, increasing pain with a Baker's cyst, we discussed the anatomy and pathophysiology, I injected the knee today, we will get x-rays and formal PT. Return to see me in 6 weeks.

## 2022-11-25 ENCOUNTER — Ambulatory Visit (INDEPENDENT_AMBULATORY_CARE_PROVIDER_SITE_OTHER): Payer: PPO

## 2022-11-25 ENCOUNTER — Ambulatory Visit
Admission: EM | Admit: 2022-11-25 | Discharge: 2022-11-25 | Disposition: A | Discharge status: Home / Self Care | Payer: PPO | Attending: Emergency Medicine | Admitting: Emergency Medicine

## 2022-11-25 DIAGNOSIS — M542 Cervicalgia: Secondary | ICD-10-CM | POA: Diagnosis not present

## 2022-11-25 DIAGNOSIS — M1711 Unilateral primary osteoarthritis, right knee: Secondary | ICD-10-CM

## 2022-11-25 DIAGNOSIS — M5030 Other cervical disc degeneration, unspecified cervical region: Secondary | ICD-10-CM | POA: Diagnosis not present

## 2022-11-25 DIAGNOSIS — M4802 Spinal stenosis, cervical region: Secondary | ICD-10-CM | POA: Diagnosis not present

## 2022-11-25 DIAGNOSIS — M25512 Pain in left shoulder: Secondary | ICD-10-CM

## 2022-11-25 DIAGNOSIS — M25561 Pain in right knee: Secondary | ICD-10-CM

## 2022-11-25 DIAGNOSIS — R519 Headache, unspecified: Secondary | ICD-10-CM

## 2022-11-25 MED ORDER — IBUPROFEN 600 MG PO TABS: 600.0000 mg | ORAL_TABLET | Freq: Three times a day (TID) | ORAL | 0 refills | Status: DC | PRN

## 2022-11-25 MED ORDER — BACLOFEN 10 MG PO TABS: 10.0000 mg | ORAL_TABLET | Freq: Three times a day (TID) | ORAL | 0 refills | Status: AC

## 2022-11-25 MED ORDER — ACETAMINOPHEN ER 650 MG PO TBCR: 650.0000 mg | EXTENDED_RELEASE_TABLET | Freq: Three times a day (TID) | ORAL | 0 refills | Status: DC | PRN

## 2022-11-25 MED ORDER — KETOROLAC TROMETHAMINE 30 MG/ML IJ SOLN
30.0000 mg | Freq: Once | INTRAMUSCULAR | Status: AC
  Administered 2022-11-25: 30 mg via INTRAMUSCULAR
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
## 2022-11-30 ENCOUNTER — Ambulatory Visit (INDEPENDENT_AMBULATORY_CARE_PROVIDER_SITE_OTHER): Payer: PPO

## 2022-11-30 ENCOUNTER — Ambulatory Visit (INDEPENDENT_AMBULATORY_CARE_PROVIDER_SITE_OTHER): Payer: PPO | Admitting: Sports Medicine

## 2022-11-30 DIAGNOSIS — M19012 Primary osteoarthritis, left shoulder: Secondary | ICD-10-CM | POA: Diagnosis not present

## 2022-11-30 DIAGNOSIS — M25712 Osteophyte, left shoulder: Secondary | ICD-10-CM | POA: Diagnosis not present

## 2022-11-30 DIAGNOSIS — S161XXA Strain of muscle, fascia and tendon at neck level, initial encounter: Secondary | ICD-10-CM | POA: Diagnosis not present

## 2022-11-30 MED ORDER — CYCLOBENZAPRINE HCL 10 MG PO TABS: ORAL_TABLET | ORAL | 0 refills | Status: DC

## 2022-11-30 MED ORDER — HYDROCODONE-ACETAMINOPHEN 10-325 MG PO TABS: 1.0000 | ORAL_TABLET | Freq: Three times a day (TID) | ORAL | 0 refills | Status: DC | PRN

## 2022-11-30 MED ORDER — PREDNISONE 50 MG PO TABS: ORAL_TABLET | ORAL | 0 refills | Status: DC
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
## 2022-12-01 ENCOUNTER — Ambulatory Visit: Payer: PPO | Attending: Sports Medicine | Admitting: Physical Therapy

## 2022-12-01 ENCOUNTER — Encounter: Payer: Self-pay | Admitting: Physical Therapy

## 2022-12-01 ENCOUNTER — Other Ambulatory Visit: Payer: Self-pay

## 2022-12-01 DIAGNOSIS — M1711 Unilateral primary osteoarthritis, right knee: Secondary | ICD-10-CM | POA: Insufficient documentation

## 2022-12-01 DIAGNOSIS — G8929 Other chronic pain: Secondary | ICD-10-CM | POA: Insufficient documentation

## 2022-12-01 DIAGNOSIS — M6281 Muscle weakness (generalized): Secondary | ICD-10-CM | POA: Diagnosis present

## 2022-12-01 DIAGNOSIS — M25551 Pain in right hip: Secondary | ICD-10-CM | POA: Insufficient documentation

## 2022-12-01 DIAGNOSIS — M25561 Pain in right knee: Secondary | ICD-10-CM | POA: Diagnosis present
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
## 2022-12-06 ENCOUNTER — Ambulatory Visit
Admission: EM | Admit: 2022-12-06 | Discharge: 2022-12-06 | Disposition: A | Discharge status: Home / Self Care | Payer: PPO | Attending: Emergency Medicine | Admitting: Emergency Medicine

## 2022-12-06 ENCOUNTER — Other Ambulatory Visit: Payer: Self-pay

## 2022-12-06 DIAGNOSIS — M5030 Other cervical disc degeneration, unspecified cervical region: Secondary | ICD-10-CM | POA: Diagnosis not present

## 2022-12-06 DIAGNOSIS — M4802 Spinal stenosis, cervical region: Secondary | ICD-10-CM | POA: Diagnosis not present

## 2022-12-06 MED ORDER — KETOROLAC TROMETHAMINE 30 MG/ML IJ SOLN
30.0000 mg | Freq: Once | INTRAMUSCULAR | Status: AC
  Administered 2022-12-06: 30 mg via INTRAMUSCULAR

## 2022-12-06 MED ORDER — METHYLPREDNISOLONE ACETATE 80 MG/ML IJ SUSP
80.0000 mg | Freq: Once | INTRAMUSCULAR | Status: AC
  Administered 2022-12-06: 80 mg via INTRAMUSCULAR

## 2022-12-06 MED ORDER — KETOROLAC TROMETHAMINE 30 MG/ML IJ SOLN: 30.0000 mg | Freq: Once | INTRAMUSCULAR | Status: DC

## 2022-12-06 MED ORDER — METHYLPREDNISOLONE 8 MG PO TABS: ORAL_TABLET | ORAL | 0 refills | Status: AC

## 2022-12-06 MED ORDER — OXYCODONE HCL 5 MG PO CAPS: 5.0000 mg | ORAL_CAPSULE | Freq: Three times a day (TID) | ORAL | 0 refills | Status: AC | PRN

## 2022-12-06 MED ORDER — OXYCODONE HCL 5 MG PO CAPS: 5.0000 mg | ORAL_CAPSULE | Freq: Three times a day (TID) | ORAL | 0 refills | Status: DC | PRN

## 2022-12-06 MED ORDER — ACETAMINOPHEN 500 MG PO TABS: 1000.0000 mg | ORAL_TABLET | Freq: Three times a day (TID) | ORAL | 2 refills | Status: AC

## 2022-12-06 MED ORDER — MELOXICAM 15 MG PO TABS: 15.0000 mg | ORAL_TABLET | Freq: Every day | ORAL | 2 refills | Status: AC
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
## 2022-12-06 NOTE — Discharge Instructions (Addendum)
The mainstay of therapy for musculoskeletal pain is reduction of inflammation and relaxation of tension which is causing inflammation.  Keep in mind, pain always begets more pain.  To help you stay ahead of your pain and inflammation, I have provided the following regimen for you:  Please discontinue prednisone 50 mg at this time.  You are welcome to take 1 more dose of hydrocodone this evening at bedtime but after that please discontinue this medication as well.   During your visit today, you received an injection of a high-dose steroidal anti-inflammatory medication that should significantly reduce your pain for the next 6 to 8 hours.  To continue this potent anti-inflammatory pain relief, I have sent a prescription for methylprednisolone to your pharmacy.  You will begin taking it tomorrow morning with your first meal of the day.  Please take all tablets exactly as prescribed.  This is a tapering dose meaning you will take 4 tablets daily initially tapering down to 1 tablet daily until complete.  Please begin taking Tylenol 1000 mg 3 times daily (every 8 hours) as soon as you get home.  As we discussed, it is safe to take 3000 mg of Tylenol in a 24-hour.  The maximum recommended dose of Tylenol in a 24-hour period is 4000 mg.  Tylenol is cleared through your liver and using 3000 mg in a 24-hour period daily over a long period of time has been proven to be safe and effective for pain relief.    Because your hydrocodone contains Tylenol, I have provided you with a different narcotic pain medication which does not contain Tylenol so that you do not have to worry about your total maximum dose of Tylenol per day.  Also as we discussed, Tylenol works best when taken on a scheduled basis, every 8 hours.   As above, this evening, please take 1 tablet of hydrocodone before you go to bed.  When you fill your prescription for oxycodone, which does not contain Tylenol, you are welcome to take 1 to 2 tablets every  8 hours as needed for moderate to severe pain not relieved by Tylenol.  Please keep in mind that it is always easier to treat a little bit of pain that is to treat a lot of pain so please take it when you begin to feel the pain returning.  Do not wait until you are in a lot of pain to reach for this medication.   Once you have completed your prescription for methylprednisolone, the steroid, please begin taking meloxicam 15 mg once daily.  This is a nonsteroidal anti-inflammatory pain medication that works similarly to ibuprofen and naproxen.  Please do not take ibuprofen or naproxen while taking meloxicam.  Meloxicam is also safe when taken once daily and is also safe to take at the same time as taking Tylenol.   During the day, please set aside time to apply ice to the affected area 4 times daily for 20 minutes each application.  This can be achieved by using a bag of frozen peas or corn, a Ziploc bag filled with ice and water, or Ziploc bag filled with half rubbing alcohol and half Dawn dish detergent, frozen into a slush.  Please be careful not to apply ice directly to your skin, always place a soft cloth between you and the ice pack.  Over-the-counter products such as IcyHot and Biofreeze do not work nearly as well.  I do not recommend applying heat to sore muscles as sometimes this  can create more inflammation than it relieves.   Please consider discussing referral to physical therapy with your primary care provider for your neck pain.     Please avoid attempts to stretch or strengthen the affected area until you are feeling completely pain-free.  Attempts to do so will only prolong the healing process.   Thank you for visiting urgent care today.  We appreciate the opportunity to participate in your care.

## 2022-12-08 ENCOUNTER — Ambulatory Visit: Payer: PPO

## 2022-12-08 DIAGNOSIS — M25551 Pain in right hip: Secondary | ICD-10-CM | POA: Diagnosis not present

## 2022-12-08 DIAGNOSIS — M6281 Muscle weakness (generalized): Secondary | ICD-10-CM

## 2022-12-08 DIAGNOSIS — G8929 Other chronic pain: Secondary | ICD-10-CM
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
## 2022-12-10 ENCOUNTER — Ambulatory Visit: Payer: PPO

## 2022-12-10 DIAGNOSIS — M25551 Pain in right hip: Secondary | ICD-10-CM | POA: Diagnosis not present

## 2022-12-10 DIAGNOSIS — M6281 Muscle weakness (generalized): Secondary | ICD-10-CM

## 2022-12-10 DIAGNOSIS — G8929 Other chronic pain: Secondary | ICD-10-CM
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
## 2022-12-14 ENCOUNTER — Ambulatory Visit (INDEPENDENT_AMBULATORY_CARE_PROVIDER_SITE_OTHER): Payer: PPO | Admitting: Sports Medicine

## 2022-12-14 DIAGNOSIS — S161XXD Strain of muscle, fascia and tendon at neck level, subsequent encounter: Secondary | ICD-10-CM | POA: Diagnosis not present

## 2022-12-14 MED ORDER — HYDROCODONE-IBUPROFEN 5-200 MG PO TABS: 1.0000 | ORAL_TABLET | Freq: Three times a day (TID) | ORAL | 0 refills | Status: DC | PRN
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
## 2022-12-15 ENCOUNTER — Ambulatory Visit: Payer: PPO | Admitting: Physical Therapy

## 2022-12-17 ENCOUNTER — Ambulatory Visit: Payer: PPO | Attending: Sports Medicine

## 2022-12-17 DIAGNOSIS — M25561 Pain in right knee: Secondary | ICD-10-CM | POA: Insufficient documentation

## 2022-12-17 DIAGNOSIS — M542 Cervicalgia: Secondary | ICD-10-CM | POA: Diagnosis present

## 2022-12-17 DIAGNOSIS — M6281 Muscle weakness (generalized): Secondary | ICD-10-CM | POA: Diagnosis present

## 2022-12-17 DIAGNOSIS — M25551 Pain in right hip: Secondary | ICD-10-CM | POA: Diagnosis present

## 2022-12-17 DIAGNOSIS — G8929 Other chronic pain: Secondary | ICD-10-CM | POA: Insufficient documentation
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
## 2022-12-22 ENCOUNTER — Ambulatory Visit: Payer: PPO | Admitting: Physical Therapy

## 2022-12-22 ENCOUNTER — Encounter: Payer: Self-pay | Admitting: Physical Therapy

## 2022-12-22 DIAGNOSIS — M542 Cervicalgia: Secondary | ICD-10-CM

## 2022-12-22 DIAGNOSIS — M6281 Muscle weakness (generalized): Secondary | ICD-10-CM

## 2022-12-22 DIAGNOSIS — M25551 Pain in right hip: Secondary | ICD-10-CM | POA: Diagnosis not present
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
## 2022-12-24 ENCOUNTER — Ambulatory Visit: Payer: PPO

## 2022-12-24 DIAGNOSIS — M6281 Muscle weakness (generalized): Secondary | ICD-10-CM

## 2022-12-24 DIAGNOSIS — M25551 Pain in right hip: Secondary | ICD-10-CM | POA: Diagnosis not present

## 2022-12-24 DIAGNOSIS — M542 Cervicalgia: Secondary | ICD-10-CM

## 2022-12-24 DIAGNOSIS — G8929 Other chronic pain: Secondary | ICD-10-CM
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
## 2022-12-28 ENCOUNTER — Other Ambulatory Visit: Payer: Self-pay | Admitting: Sports Medicine

## 2022-12-28 DIAGNOSIS — S161XXA Strain of muscle, fascia and tendon at neck level, initial encounter: Secondary | ICD-10-CM

## 2022-12-29 ENCOUNTER — Ambulatory Visit: Payer: PPO

## 2022-12-29 MED ORDER — CYCLOBENZAPRINE HCL 10 MG PO TABS: ORAL_TABLET | ORAL | 0 refills | Status: DC

## 2022-12-30 ENCOUNTER — Other Ambulatory Visit: Payer: Self-pay | Admitting: Family Medicine

## 2023-01-01 ENCOUNTER — Encounter: Payer: Self-pay | Admitting: Physical Therapy

## 2023-01-01 ENCOUNTER — Ambulatory Visit: Payer: PPO | Admitting: Physical Therapy

## 2023-01-01 DIAGNOSIS — M6281 Muscle weakness (generalized): Secondary | ICD-10-CM

## 2023-01-01 DIAGNOSIS — M25551 Pain in right hip: Secondary | ICD-10-CM

## 2023-01-01 DIAGNOSIS — M542 Cervicalgia: Secondary | ICD-10-CM

## 2023-01-01 DIAGNOSIS — G8929 Other chronic pain: Secondary | ICD-10-CM
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
## 2023-01-04 ENCOUNTER — Ambulatory Visit: Payer: PPO | Admitting: Family Medicine

## 2023-01-05 ENCOUNTER — Ambulatory Visit: Payer: PPO

## 2023-01-05 DIAGNOSIS — M25551 Pain in right hip: Secondary | ICD-10-CM

## 2023-01-05 DIAGNOSIS — M6281 Muscle weakness (generalized): Secondary | ICD-10-CM

## 2023-01-05 DIAGNOSIS — M542 Cervicalgia: Secondary | ICD-10-CM

## 2023-01-05 DIAGNOSIS — G8929 Other chronic pain: Secondary | ICD-10-CM
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
## 2023-01-07 ENCOUNTER — Ambulatory Visit: Payer: PPO

## 2023-01-07 DIAGNOSIS — M6281 Muscle weakness (generalized): Secondary | ICD-10-CM

## 2023-01-07 DIAGNOSIS — G8929 Other chronic pain: Secondary | ICD-10-CM

## 2023-01-07 DIAGNOSIS — M25551 Pain in right hip: Secondary | ICD-10-CM | POA: Diagnosis not present

## 2023-01-07 DIAGNOSIS — M542 Cervicalgia: Secondary | ICD-10-CM
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
## 2023-01-12 ENCOUNTER — Ambulatory Visit: Payer: PPO | Attending: Sports Medicine

## 2023-01-12 DIAGNOSIS — G8929 Other chronic pain: Secondary | ICD-10-CM | POA: Diagnosis present

## 2023-01-12 DIAGNOSIS — M25551 Pain in right hip: Secondary | ICD-10-CM | POA: Diagnosis present

## 2023-01-12 DIAGNOSIS — M6281 Muscle weakness (generalized): Secondary | ICD-10-CM | POA: Insufficient documentation

## 2023-01-12 DIAGNOSIS — M25561 Pain in right knee: Secondary | ICD-10-CM | POA: Insufficient documentation

## 2023-01-12 DIAGNOSIS — M542 Cervicalgia: Secondary | ICD-10-CM | POA: Insufficient documentation
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
## 2023-01-14 ENCOUNTER — Ambulatory Visit: Payer: PPO | Admitting: Physical Therapy

## 2023-01-14 ENCOUNTER — Encounter: Payer: Self-pay | Admitting: Physical Therapy

## 2023-01-14 DIAGNOSIS — M25551 Pain in right hip: Secondary | ICD-10-CM

## 2023-01-14 DIAGNOSIS — G8929 Other chronic pain: Secondary | ICD-10-CM

## 2023-01-14 DIAGNOSIS — M542 Cervicalgia: Secondary | ICD-10-CM | POA: Diagnosis not present

## 2023-01-14 DIAGNOSIS — M6281 Muscle weakness (generalized): Secondary | ICD-10-CM
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
## 2023-01-18 ENCOUNTER — Ambulatory Visit (INDEPENDENT_AMBULATORY_CARE_PROVIDER_SITE_OTHER): Payer: PPO | Admitting: Sports Medicine

## 2023-01-18 DIAGNOSIS — M5030 Other cervical disc degeneration, unspecified cervical region: Secondary | ICD-10-CM | POA: Diagnosis not present

## 2023-01-18 NOTE — Progress Notes (Signed)
    Procedures performed today:    None.  Independent interpretation of notes and tests performed by another provider:   None.  Brief History, Exam, Impression, and Recommendations:    Degenerative disc disease, cervical Very pleasant 67 year old male, known cervical DDD, did well with dry needling in the knee, he would like to do dry needling for the neck as well, I added some home conditioning, x-rays are in the chart, adding a formal PT order.    ____________________________________________ Ihor Austin. Benjamin Stain, M.D., ABFM., CAQSM., AME. Primary Care and Sports Medicine [facility]  Adjunct Professor of Family Medicine  [employer]  Restaurant manager, fast food

## 2023-01-18 NOTE — Assessment & Plan Note (Signed)
Very pleasant 67 year old male, known cervical DDD, did well with dry needling in the knee, he would like to do dry needling for the neck as well, I added some home conditioning, x-rays are in the chart, adding a formal PT order.

## 2023-01-19 ENCOUNTER — Encounter: Payer: Self-pay | Admitting: Physical Therapy

## 2023-01-19 ENCOUNTER — Ambulatory Visit: Payer: PPO | Admitting: Physical Therapy

## 2023-01-19 DIAGNOSIS — G8929 Other chronic pain: Secondary | ICD-10-CM

## 2023-01-19 DIAGNOSIS — M542 Cervicalgia: Secondary | ICD-10-CM

## 2023-01-19 DIAGNOSIS — M25551 Pain in right hip: Secondary | ICD-10-CM

## 2023-01-19 DIAGNOSIS — M6281 Muscle weakness (generalized): Secondary | ICD-10-CM
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
## 2023-01-21 ENCOUNTER — Ambulatory Visit: Payer: PPO | Admitting: Physical Therapy

## 2023-01-21 ENCOUNTER — Encounter: Payer: Self-pay | Admitting: Physical Therapy

## 2023-01-21 DIAGNOSIS — M542 Cervicalgia: Secondary | ICD-10-CM | POA: Diagnosis not present

## 2023-01-21 DIAGNOSIS — M6281 Muscle weakness (generalized): Secondary | ICD-10-CM

## 2023-01-21 DIAGNOSIS — M25551 Pain in right hip: Secondary | ICD-10-CM

## 2023-01-21 DIAGNOSIS — G8929 Other chronic pain: Secondary | ICD-10-CM
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
## 2023-03-01 ENCOUNTER — Ambulatory Visit (INDEPENDENT_AMBULATORY_CARE_PROVIDER_SITE_OTHER): Payer: PPO | Admitting: Sports Medicine

## 2023-03-01 DIAGNOSIS — M5030 Other cervical disc degeneration, unspecified cervical region: Secondary | ICD-10-CM | POA: Diagnosis not present

## 2023-03-01 DIAGNOSIS — E1169 Type 2 diabetes mellitus with other specified complication: Secondary | ICD-10-CM | POA: Diagnosis not present

## 2023-03-01 DIAGNOSIS — I10 Essential (primary) hypertension: Secondary | ICD-10-CM | POA: Diagnosis not present

## 2023-03-01 DIAGNOSIS — E782 Mixed hyperlipidemia: Secondary | ICD-10-CM | POA: Diagnosis not present

## 2023-03-01 NOTE — Assessment & Plan Note (Signed)
Keats returns, he is doing really well after physical therapy and dry needling, return as needed.

## 2023-03-01 NOTE — Progress Notes (Signed)
    Procedures performed today:    None.  Independent interpretation of notes and tests performed by another provider:   None.  Brief History, Exam, Impression, and Recommendations:    Degenerative disc disease, cervical Yuvin returns, he is doing really well after physical therapy and dry needling, return as needed.    ____________________________________________ Ihor Austin. Benjamin Stain, M.D., ABFM., CAQSM., AME. Primary Care and Sports Medicine [facility]  Adjunct Professor of Family Medicine  [employer]  Restaurant manager, fast food

## 2023-03-02 LAB — COMPLETE METABOLIC PANEL WITH GFR
AG Ratio: 2.3 (calc) (ref 1.0–2.5)
ALT: 22 U/L (ref 9–46)
AST: 18 U/L (ref 10–35)
Albumin: 4.6 g/dL (ref 3.6–5.1)
Alkaline phosphatase (APISO): 76 U/L (ref 35–144)
BUN: 23 mg/dL (ref 7–25)
CO2: 27 mmol/L (ref 20–32)
Calcium: 10.1 mg/dL (ref 8.6–10.3)
Chloride: 104 mmol/L (ref 98–110)
Creat: 1.06 mg/dL (ref 0.70–1.35)
Globulin: 2 g/dL (calc) (ref 1.9–3.7)
Glucose, Bld: 122 mg/dL — ABNORMAL HIGH (ref 65–99)
Potassium: 4.9 mmol/L (ref 3.5–5.3)
Sodium: 141 mmol/L (ref 135–146)
Total Bilirubin: 0.5 mg/dL (ref 0.2–1.2)
Total Protein: 6.6 g/dL (ref 6.1–8.1)
eGFR: 77 mL/min/{1.73_m2} (ref >=60)

## 2023-03-02 LAB — CBC WITH DIFFERENTIAL/PLATELET
Absolute Monocytes: 1092 cells/uL — ABNORMAL HIGH (ref 200–950)
Basophils Absolute: 94 cells/uL (ref 0–200)
Basophils Relative: 0.9 %
Eosinophils Absolute: 395 cells/uL (ref 15–500)
Eosinophils Relative: 3.8 %
HCT: 49 % (ref 38.5–50.0)
Hemoglobin: 17 g/dL (ref 13.2–17.1)
Lymphs Abs: 3047 cells/uL (ref 850–3900)
MCH: 32 pg (ref 27.0–33.0)
MCHC: 34.7 g/dL (ref 32.0–36.0)
MCV: 92.3 fL (ref 80.0–100.0)
MPV: 10.5 fL (ref 7.5–12.5)
Monocytes Relative: 10.5 %
Neutro Abs: 5772 cells/uL (ref 1500–7800)
Neutrophils Relative %: 55.5 %
Platelets: 322 10*3/uL (ref 140–400)
RBC: 5.31 10*6/uL (ref 4.20–5.80)
RDW: 11.7 % (ref 11.0–15.0)
Total Lymphocyte: 29.3 %
WBC: 10.4 10*3/uL (ref 3.8–10.8)

## 2023-03-02 LAB — LIPID PANEL W/REFLEX DIRECT LDL
Cholesterol: 172 mg/dL (ref <200)
HDL: 57 mg/dL (ref >=40)
LDL Cholesterol (Calc): 86 mg/dL (calc)
Non-HDL Cholesterol (Calc): 115 mg/dL (calc) (ref <130)
Total CHOL/HDL Ratio: 3 (calc) (ref <5.0)
Triglycerides: 202 mg/dL — ABNORMAL HIGH (ref <150)

## 2023-03-02 LAB — HEMOGLOBIN A1C
Hgb A1c MFr Bld: 7.5 % of total Hgb — ABNORMAL HIGH (ref <5.7)
Mean Plasma Glucose: 169 mg/dL
eAG (mmol/L): 9.3 mmol/L

## 2023-04-01 ENCOUNTER — Ambulatory Visit: Payer: PPO | Attending: Family Medicine

## 2023-04-01 ENCOUNTER — Ambulatory Visit (INDEPENDENT_AMBULATORY_CARE_PROVIDER_SITE_OTHER): Payer: PPO | Admitting: Family Medicine

## 2023-04-01 ENCOUNTER — Encounter: Payer: Self-pay | Admitting: Family Medicine

## 2023-04-01 VITALS — BP 127/71 | HR 59 | Ht 72.0 in | Wt 182.0 lb

## 2023-04-01 DIAGNOSIS — R61 Generalized hyperhidrosis: Secondary | ICD-10-CM

## 2023-04-01 DIAGNOSIS — R002 Palpitations: Secondary | ICD-10-CM

## 2023-04-01 NOTE — Progress Notes (Signed)
Jamie Rice - 67 y.o. male MRN [id number]  Date of birth: 1956-08-27  Subjective Chief Complaint  Patient presents with   Diabetes    HPI Jamie Rice is 67 y.o. male here today with complaint of questionable heat intolerance.  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  He did relate after his MI for a brief period.  He has taken time off from his concession company for a few years because of COVID and recently restarted this year.  He did have an episode recently where he had profuse sweating, racing heart sensation, nausea and fatigue feeling.  He has not had any chest pain, dyspnea, headaches.  He does report that he is hydrating well.  He is eating regularly throughout the day.    ROS:  A comprehensive ROS was completed and negative except as noted per HPI  No Known Allergies  Past Medical History:  Diagnosis Date   Anxiety    Arthritis    Back pain    CAD (coronary artery disease)    Depression    HTN (hypertension)    Hypercholesteremia     Past Surgical History:  Procedure Laterality Date   HERNIA REPAIR     PTCA with stent - [place]     VASECTOMY      Social History   Socioeconomic History   Marital status: Widowed    Spouse name: Not on file   Number of children: 2   Years of education: 14   Highest education level: Some college, no degree  Occupational History   Occupation: Delivery    Comment: Part-time  Tobacco Use   Smoking status: Former   Smokeless tobacco: Never  Building services engineer Use: Never used  Substance and Sexual Activity   Alcohol use: Yes    Comment: rarely   Drug use: No   Sexual activity: Not on file  Other Topics Concern   Not on file  Social History Narrative   Lives alone. He has two children. He enjoys cooking and bowling.   Social Determinants of Health    Financial Resource Strain: Medium Risk (01/03/2023)   Overall Financial Resource Strain (CARDIA)    Difficulty of Paying Living Expenses: Somewhat hard  Food Insecurity: No Food Insecurity (01/03/2023)   Hunger Vital Sign    Worried About Running Out of Food in the Last Year: Never true    Ran Out of Food in the Last Year: Never true  Transportation Needs: No Transportation Needs (01/03/2023)   PRAPARE - Administrator, Civil Service (Medical): No    Lack of Transportation (Non-Medical): No  Physical Activity: Insufficiently Active (01/03/2023)   Exercise Vital Sign    Days of Exercise per Week: 3 days    Minutes of Exercise per Session: 20 min  Stress: Stress Concern Present (01/03/2023)   [employer] of Occupational Health - Occupational Stress Questionnaire    Feeling of Stress : Rather much  Social Connections: Socially Isolated (01/03/2023)   Social Connection and Isolation Panel [NHANES]    Frequency of Communication with Friends and Family: Three times a week    Frequency of Social Gatherings with Friends and Family: Three times a week    Attends Religious Services: Never    Active Member of Clubs or Organizations: No    Attends  Club or Organization Meetings: Never    Marital Status: Widowed    Family History  Problem Relation Age of Onset   Heart disease Father    Heart attack Father    Hypertension Father    Stroke Father        X4   Hypertension Paternal Uncle        X4   Stroke Paternal Uncle     Health Maintenance  Topic Date Due   Diabetic kidney evaluation - Urine ACR  Never done   Medicare Annual Wellness (AWV)  05/30/2023   Hepatitis C Screening  05/19/2023 (Originally 02/26/1974)   COVID-19 Vaccine (5 - 2023-24 season) 08/01/2023 (Originally 06/12/2022)   Pneumonia Vaccine 28+ Years old (1 of 1 - PCV) 03/31/2024 (Originally 02/26/2021)   INFLUENZA VACCINE  05/13/2023   OPHTHALMOLOGY EXAM  08/14/2023   HEMOGLOBIN A1C  09/01/2023   Diabetic  kidney evaluation - eGFR measurement  02/29/2024   FOOT EXAM  03/31/2024   DTaP/Tdap/Td (2 - Td or Tdap) 05/28/2028   Colonoscopy  11/11/2028   Zoster Vaccines- Shingrix  Completed   HPV VACCINES  Aged Out     ----------------------------------------------------------------------------------------------------------------------------------------------------------------------------------------------------------------- Physical Exam BP 127/71 (BP Location: Left Arm, Patient Position: Sitting, Cuff Size: Normal)   Pulse (!) 59   Ht 6' (1.829 m)   Wt 182 lb (82.6 kg)   SpO2 96%   BMI 24.68 kg/m   Physical Exam Constitutional:      Appearance: Normal appearance.  HENT:     Head: Normocephalic and atraumatic.  Eyes:     General: No scleral icterus. Cardiovascular:     Rate and Rhythm: Normal rate and regular rhythm.  Pulmonary:     Effort: Pulmonary effort is normal.     Breath sounds: Normal breath sounds.  Musculoskeletal:     Cervical back: Neck supple.  Neurological:     Mental Status: He is alert.  Psychiatric:        Mood and Affect: Mood normal.        Behavior: Behavior normal.     ------------------------------------------------------------------------------------------------------------------------------------------------------------------------------------------------------------------- Assessment and Plan  Palpitations Intermittent episodes of excessive sweating, nausea and palpitations.  Zio patch ordered.  Rechecking electrolytes, TSH.  Instructed to seek emergency care having chest pain or worsening symptoms.   No orders of the defined types were placed in this encounter.   No follow-ups on file.    This visit occurred during the SARS-CoV-2 public health emergency.  Safety protocols were in place, including screening questions prior to the visit, additional usage of staff PPE, and extensive cleaning of exam room while observing appropriate contact time  as indicated for disinfecting solutions.

## 2023-04-01 NOTE — Patient Instructions (Signed)
Continue to stay well hydrated.

## 2023-04-01 NOTE — Progress Notes (Unsigned)
Enrolled for [employer] to mail a ZIO XT long term holter monitor to the patients address on file.   Dr. Varanasi to read. 

## 2023-04-01 NOTE — Assessment & Plan Note (Signed)
Intermittent episodes of excessive sweating, nausea and palpitations.  Zio patch ordered.  Rechecking electrolytes, TSH.  Instructed to seek emergency care having chest pain or worsening symptoms.

## 2023-04-02 LAB — BASIC METABOLIC PANEL
BUN: 18 mg/dL (ref 7–25)
CO2: 30 mmol/L (ref 20–32)
Calcium: 9.7 mg/dL (ref 8.6–10.3)
Chloride: 102 mmol/L (ref 98–110)
Creat: 1.09 mg/dL (ref 0.70–1.35)
Glucose, Bld: 162 mg/dL — ABNORMAL HIGH (ref 65–99)
Potassium: 4.3 mmol/L (ref 3.5–5.3)
Sodium: 140 mmol/L (ref 135–146)

## 2023-04-02 LAB — TSH: TSH: 3.34 mIU/L (ref 0.40–4.50)

## 2023-04-02 LAB — MAGNESIUM: Magnesium: 2.1 mg/dL (ref 1.5–2.5)

## 2023-04-05 DIAGNOSIS — R002 Palpitations: Secondary | ICD-10-CM

## 2023-04-05 DIAGNOSIS — R61 Generalized hyperhidrosis: Secondary | ICD-10-CM

## 2023-04-08 ENCOUNTER — Other Ambulatory Visit: Payer: Self-pay | Admitting: Sports Medicine

## 2023-04-08 DIAGNOSIS — S161XXA Strain of muscle, fascia and tendon at neck level, initial encounter: Secondary | ICD-10-CM

## 2023-04-17 ENCOUNTER — Other Ambulatory Visit: Payer: Self-pay | Admitting: Sports Medicine

## 2023-04-17 DIAGNOSIS — S161XXA Strain of muscle, fascia and tendon at neck level, initial encounter: Secondary | ICD-10-CM

## 2023-04-22 DIAGNOSIS — R002 Palpitations: Secondary | ICD-10-CM | POA: Diagnosis not present

## 2023-04-22 DIAGNOSIS — R61 Generalized hyperhidrosis: Secondary | ICD-10-CM | POA: Diagnosis not present

## 2023-04-28 ENCOUNTER — Encounter: Payer: Self-pay | Admitting: Family Medicine

## 2023-04-28 ENCOUNTER — Ambulatory Visit: Payer: PPO | Admitting: Family Medicine

## 2023-04-28 VITALS — Ht 72.0 in | Wt 181.0 lb

## 2023-04-28 DIAGNOSIS — R002 Palpitations: Secondary | ICD-10-CM

## 2023-04-28 DIAGNOSIS — I2510 Atherosclerotic heart disease of native coronary artery without angina pectoris: Secondary | ICD-10-CM

## 2023-04-28 DIAGNOSIS — I10 Essential (primary) hypertension: Secondary | ICD-10-CM | POA: Diagnosis not present

## 2023-04-28 MED ORDER — CLOPIDOGREL BISULFATE 75 MG PO TABS: 75.0000 mg | ORAL_TABLET | Freq: Every day | ORAL | 0 refills | Status: DC

## 2023-04-28 NOTE — Assessment & Plan Note (Signed)
Using a Zio patch which showed a few short runs of SVT.  Improved since discontinuing beet root extract.  He will plan to follow-up with his cardiologist as well.

## 2023-04-28 NOTE — Assessment & Plan Note (Signed)
He is followed by cardiology.  Recent Zio patch that showed a few runs of SVT which were short-lived.  He did try to pick up his clopidogrel recently which was not available at [facility].  I did go ahead and send a courtesy refill of this over for him.  He cannot get in with his cardiologist for a few months.  Will see if we can get this moved up for him.

## 2023-04-28 NOTE — Progress Notes (Signed)
Jamie Rice - 67 y.o. male MRN [id number]  Date of birth: 1955-12-08  Subjective Chief Complaint  Patient presents with   Medication Refill    HPI Jamie Rice is a 67 y.o. male here today for follow up visit.    He expresses some frustration today.  Showed up and his appointment had been canceled.  We were able to work him in today.  He recently had Zio patch monitor.  He reports that patch kept falling off from sweating so he finally got frustrated and left if off.  Data recorded did show a wear time of 8 days and 21 hours.  Predominant rhythm of NSR.  He did have a couple episodes of SVT lasting a few seconds.  No significant ectopy noted.  He has cut out beet root extract and has not noticed any significant intolerance to the heat since discontinuing this.  He did go to refill his medications at [facility] and rather than feeling his clopidogrel he intermittently filled his Flexeril.  He has been off his clopidogrel for a few days and needs a refill of this.  ROS:  A comprehensive ROS was completed and negative except as noted per HPI  No Known Allergies  Past Medical History:  Diagnosis Date   Anxiety    Arthritis    Back pain    CAD (coronary artery disease)    Depression    HTN (hypertension)    Hypercholesteremia     Past Surgical History:  Procedure Laterality Date   HERNIA REPAIR     PTCA with stent - [place]     VASECTOMY      Social History   Socioeconomic History   Marital status: Widowed    Spouse name: Not on file   Number of children: 2   Years of education: 14   Highest education level: Some college, no degree  Occupational History   Occupation: Delivery    Comment: Part-time  Tobacco Use   Smoking status: Former   Smokeless tobacco: Never  Advertising account planner   Vaping status: Never Used  Substance and Sexual Activity   Alcohol use: Yes    Comment: rarely   Drug use: No   Sexual activity: Not on file  Other Topics Concern   Not on file   Social History Narrative   Lives alone. He has two children. He enjoys cooking and bowling.   Social Determinants of Health   Financial Resource Strain: Medium Risk (01/03/2023)   Overall Financial Resource Strain (CARDIA)    Difficulty of Paying Living Expenses: Somewhat hard  Food Insecurity: No Food Insecurity (01/03/2023)   Hunger Vital Sign    Worried About Running Out of Food in the Last Year: Never true    Ran Out of Food in the Last Year: Never true  Transportation Needs: No Transportation Needs (01/03/2023)   PRAPARE - Administrator, Civil Service (Medical): No    Lack of Transportation (Non-Medical): No  Physical Activity: Insufficiently Active (01/03/2023)   Exercise Vital Sign    Days of Exercise per Week: 3 days    Minutes of Exercise per Session: 20 min  Stress: Stress Concern Present (01/03/2023)   [employer] of Occupational Health - Occupational Stress Questionnaire    Feeling of Stress : Rather much  Social Connections: Socially Isolated (01/03/2023)   Social Connection and Isolation Panel [NHANES]    Frequency of Communication with Friends and Family: Three times a week    Frequency  of Social Gatherings with Friends and Family: Three times a week    Attends Religious Services: Never    Active Member of Clubs or Organizations: No    Attends Banker Meetings: Never    Marital Status: Widowed    Family History  Problem Relation Age of Onset   Heart disease Father    Heart attack Father    Hypertension Father    Stroke Father        X4   Hypertension Paternal Uncle        X4   Stroke Paternal Uncle     Health Maintenance  Topic Date Due   Diabetic kidney evaluation - Urine ACR  Never done   Medicare Annual Wellness (AWV)  05/30/2023   Hepatitis C Screening  05/19/2023 (Originally 02/26/1974)   COVID-19 Vaccine (5 - 2023-24 season) 08/01/2023 (Originally 06/12/2022)   Pneumonia Vaccine 84+ Years old (1 of 1 - PCV) 03/31/2024  (Originally 02/26/2021)   INFLUENZA VACCINE  05/13/2023   OPHTHALMOLOGY EXAM  08/14/2023   HEMOGLOBIN A1C  09/01/2023   Diabetic kidney evaluation - eGFR measurement  03/31/2024   FOOT EXAM  03/31/2024   DTaP/Tdap/Td (2 - Td or Tdap) 05/28/2028   Colonoscopy  11/11/2028   Zoster Vaccines- Shingrix  Completed   HPV VACCINES  Aged Out     ----------------------------------------------------------------------------------------------------------------------------------------------------------------------------------------------------------------- Physical Exam Ht 6' (1.829 m)   Wt 181 lb (82.1 kg)   BMI 24.55 kg/m   Physical Exam Constitutional:      Appearance: Normal appearance.  Eyes:     General: No scleral icterus. Cardiovascular:     Rate and Rhythm: Normal rate and regular rhythm.  Pulmonary:     Effort: Pulmonary effort is normal.     Breath sounds: Normal breath sounds.  Musculoskeletal:     Cervical back: Neck supple.  Neurological:     Mental Status: He is alert.  Psychiatric:        Mood and Affect: Mood normal.        Behavior: Behavior normal.     ------------------------------------------------------------------------------------------------------------------------------------------------------------------------------------------------------------------- Assessment and Plan  CAD (coronary artery disease) He is followed by cardiology.  Recent Zio patch that showed a few runs of SVT which were short-lived.  He did try to pick up his clopidogrel recently which was not available at [facility].  I did go ahead and send a courtesy refill of this over for him.  He cannot get in with his cardiologist for a few months.  Will see if we can get this moved up for him.  HTN (hypertension) Blood pressures remain well-controlled with current medications.  No side effects at this time.  Palpitations Using a Zio patch which showed a few short runs of SVT.  Improved since  discontinuing beet root extract.  He will plan to follow-up with his cardiologist as well.   Meds ordered this encounter  Medications   clopidogrel (PLAVIX) 75 MG tablet    Sig: Take 1 tablet (75 mg total) by mouth daily.    Dispense:  90 tablet    Refill:  0    No follow-ups on file.    This visit occurred during the SARS-CoV-2 public health emergency.  Safety protocols were in place, including screening questions prior to the visit, additional usage of staff PPE, and extensive cleaning of exam room while observing appropriate contact time as indicated for disinfecting solutions.

## 2023-04-28 NOTE — Assessment & Plan Note (Signed)
Blood pressures remain well-controlled with current medications.  No side effects at this time.

## 2023-05-10 ENCOUNTER — Other Ambulatory Visit: Payer: Self-pay | Admitting: Family Medicine

## 2023-05-16 ENCOUNTER — Other Ambulatory Visit: Payer: Self-pay | Admitting: Sports Medicine

## 2023-05-16 DIAGNOSIS — S161XXA Strain of muscle, fascia and tendon at neck level, initial encounter: Secondary | ICD-10-CM

## 2023-05-31 ENCOUNTER — Ambulatory Visit (INDEPENDENT_AMBULATORY_CARE_PROVIDER_SITE_OTHER): Payer: PPO | Admitting: Family Medicine

## 2023-05-31 DIAGNOSIS — Z0000 Encounter for general adult medical examination without abnormal findings: Secondary | ICD-10-CM | POA: Diagnosis not present

## 2023-05-31 NOTE — Progress Notes (Signed)
MEDICARE ANNUAL WELLNESS VISIT  05/31/2023  Telephone Visit Disclaimer This Medicare AWV was conducted by telephone due to national recommendations for restrictions regarding the COVID-19 Pandemic (e.g. social distancing).  I verified, using two identifiers, that I am speaking with Jamie Rice or their authorized healthcare agent. I discussed the limitations, risks, security, and privacy concerns of performing an evaluation and management service by telephone and the potential availability of an in-person appointment in the future. The patient expressed understanding and agreed to proceed.  Location of Patient: Home Location of Provider (nurse):  In the office.  Subjective:    Jamie Rice is a 67 y.o. male patient of Everrett Coombe, DO who had a Medicare Annual Wellness Visit today via telephone. Adriana is Retired and lives alone. he has 2 children. he reports that he is socially active and does interact with friends/family regularly. he is moderately physically active and enjoys cooking and bowling.  Patient Care Team: Everrett Coombe, DO as PCP - General (Family Medicine) Corky Crafts, MD as PCP - Cardiology (Cardiology)     05/31/2023    1:46 PM 12/01/2022   10:30 AM 05/29/2022    1:13 PM  Advanced Directives  Does Patient Have a Medical Advance Directive? Yes Yes Yes  Type of Advance Directive Living will  Living will  Does patient want to make changes to medical advance directive? No - Patient declined  No - Patient declined    Hospital Utilization Over the Past 12 Months: # of hospitalizations or ER visits: 0 # of surgeries: 0  Review of Systems    Patient reports that his overall health is better compared to last year.  History obtained from chart review and the patient  Patient Reported Readings (BP, Pulse, CBG, Weight, etc)  Per patient no change in vitals since last visit, unable to obtain new vitals due to telehealth visit  Pain  Assessment Pain : No/denies pain     Current Medications & Allergies (verified) Allergies as of 05/31/2023   No Known Allergies      Medication List        Accurate as of May 31, 2023  1:56 PM. If you have any questions, ask your nurse or doctor.          atorvastatin 80 MG tablet Commonly known as: LIPITOR TAKE ONE TABLET BY MOUTH ONE TIME DAILY   B-12 PO Take 1,000 mcg by mouth.   Cinnamon 500 MG capsule Take 500 mg by mouth 2 (two) times daily.   clopidogrel 75 MG tablet Commonly known as: PLAVIX Take 1 tablet (75 mg total) by mouth daily.   enalapril 10 MG tablet Commonly known as: VASOTEC TAKE ONE TABLET BY MOUTH ONE TIME DAILY   FISH OIL PO Take 1,200 mg by mouth 2 (two) times daily.   GARLIC PO Take 4,742 mg by mouth.   GINSENG PO Take 2,250 mg by mouth.   GLUCOSAMINE-CHONDROITIN PO Take by mouth.   Magnesium 400 MG Caps Take by mouth.   nitroGLYCERIN 0.4 MG SL tablet Commonly known as: NITROSTAT Place 1 tablet (0.4 mg total) under the tongue every 5 (five) minutes as needed for chest pain.   NON FORMULARY Take 1 capsule by mouth every morning. TUMERIC   OVER THE COUNTER MEDICATION Take 1,000 mg by mouth. HMB supplement   vitamin k 100 MCG tablet Take 100 mcg by mouth daily.   Xigduo XR 07-999 MG Tb24 Generic drug: Dapagliflozin Pro-metFORMIN ER TAKE ONE  TABLET BY MOUTH ONE TIME DAILY        History (reviewed): Past Medical History:  Diagnosis Date   Anxiety    Arthritis    Back pain    CAD (coronary artery disease)    Depression    HTN (hypertension)    Hypercholesteremia    Past Surgical History:  Procedure Laterality Date   HERNIA REPAIR     PTCA with stent - [place]     VASECTOMY     Family History  Problem Relation Age of Onset   Heart disease Father    Heart attack Father    Hypertension Father    Stroke Father        X4   Hypertension Paternal Uncle        X4   Stroke Paternal Uncle    Social  History   Socioeconomic History   Marital status: Widowed    Spouse name: Not on file   Number of children: 2   Years of education: 14   Highest education level: Some college, no degree  Occupational History   Occupation: Delivery    Comment: Part-time   Occupation: Retired  Tobacco Use   Smoking status: Former   Smokeless tobacco: Never  Advertising account planner   Vaping status: Never Used  Substance and Sexual Activity   Alcohol use: Yes    Comment: rarely   Drug use: No   Sexual activity: Not on file  Other Topics Concern   Not on file  Social History Narrative   Lives alone. He has two children. He enjoys cooking and bowling.   Social Determinants of Health   Financial Resource Strain: Low Risk  (05/27/2023)   Overall Financial Resource Strain (CARDIA)    Difficulty of Paying Living Expenses: Not very hard  Food Insecurity: No Food Insecurity (05/27/2023)   Hunger Vital Sign    Worried About Running Out of Food in the Last Year: Never true    Ran Out of Food in the Last Year: Never true  Transportation Needs: No Transportation Needs (05/27/2023)   PRAPARE - Administrator, Civil Service (Medical): No    Lack of Transportation (Non-Medical): No  Physical Activity: Sufficiently Active (05/27/2023)   Exercise Vital Sign    Days of Exercise per Week: 5 days    Minutes of Exercise per Session: 40 min  Stress: Stress Concern Present (01/03/2023)   [employer] of Occupational Health - Occupational Stress Questionnaire    Feeling of Stress : Rather much  Social Connections: Socially Isolated (05/31/2023)   Social Connection and Isolation Panel [NHANES]    Frequency of Communication with Friends and Family: Twice a week    Frequency of Social Gatherings with Friends and Family: Twice a week    Attends Religious Services: Never    Database administrator or Organizations: No    Attends Banker Meetings: Never    Marital Status: Widowed    Activities of  Daily Living    05/27/2023    8:37 AM  In your present state of health, do you have any difficulty performing the following activities:  Hearing? 0  Vision? 0  Difficulty concentrating or making decisions? 0  Walking or climbing stairs? 0  Dressing or bathing? 0  Doing errands, shopping? 0  Preparing Food and eating ? N  Using the Toilet? N  In the past six months, have you accidently leaked urine? Y  Do you have problems with loss of  bowel control? N  Managing your Medications? N  Managing your Finances? N  Housekeeping or managing your Housekeeping? N    Patient Education/ Literacy How often do you need to have someone help you when you read instructions, pamphlets, or other written materials from your doctor or pharmacy?: 1 - Never What is the last grade level you completed in school?: some college  Exercise    Diet Patient reports consuming  2-3  meals a day and 3 snack(s) a day Patient reports that his primary diet is: Regular Patient reports that she does have regular access to food.   Depression Screen    05/31/2023    1:49 PM 04/01/2023    8:42 AM 11/18/2022    1:46 PM 05/29/2022    1:13 PM 05/18/2022    2:29 PM 05/15/2021    4:26 PM  PHQ 2/9 Scores  PHQ - 2 Score 0 2 4 0 4 2  PHQ- 9 Score  5 9 0 7 6     Fall Risk    05/31/2023    1:49 PM 05/27/2023    8:37 AM 04/01/2023    8:18 AM 11/18/2022    1:29 PM 05/29/2022    1:13 PM  Fall Risk   Falls in the past year? 1 1 1 1  0  Number falls in past yr: 0 0 0 1 0  Injury with Fall? 0 0 0 1 0  Risk for fall due to : History of fall(s)  No Fall Risks History of fall(s) No Fall Risks  Follow up Falls evaluation completed;Education provided  Falls evaluation completed Falls evaluation completed Falls evaluation completed     Objective:  Jamie Rice seemed alert and oriented and he participated appropriately during our telephone visit.  Blood Pressure Weight BMI  BP Readings from Last 3 Encounters:  04/01/23  127/71  12/06/22 132/79  11/25/22 (!) 160/79   Wt Readings from Last 3 Encounters:  04/28/23 181 lb (82.1 kg)  04/01/23 182 lb (82.6 kg)  12/06/22 188 lb (85.3 kg)   BMI Readings from Last 1 Encounters:  04/28/23 24.55 kg/m    *Unable to obtain current vital signs, weight, and BMI due to telephone visit type  Hearing/Vision  Maisie Fus did not seem to have difficulty with hearing/understanding during the telephone conversation Reports that he has had a formal eye exam by an eye care professional within the past year Reports that he has not had a formal hearing evaluation within the past year *Unable to fully assess hearing and vision during telephone visit type  Cognitive Function:    05/31/2023    1:50 PM 05/29/2022    1:21 PM  6CIT Screen  What Year? 0 points 0 points  What month? 0 points 0 points  What time? 0 points 0 points  Count back from 20 0 points 0 points  Months in reverse 0 points 0 points  Repeat phrase 0 points 0 points  Total Score 0 points 0 points   (Normal:0-7, Significant for Dysfunction: >8)  Normal Cognitive Function Screening: Yes   Immunization & Health Maintenance Record Immunization History  Administered Date(s) Administered   Fluad Quad(high Dose 65+) 07/29/2021   Influenza,inj,Quad PF,6+ Mos 08/02/2017   PFIZER(Purple Top)SARS-COV-2 Vaccination 12/20/2019, 01/10/2020, 07/27/2020   Pfizer Covid-19 Vaccine Bivalent Booster 51yrs & up 07/29/2021   Tdap 05/28/2018   Zoster Recombinant(Shingrix) 03/19/2022, 05/11/2022    Health Maintenance  Topic Date Due   Diabetic kidney evaluation - Urine ACR  06/01/2023 (  Originally 02/26/1974)   COVID-19 Vaccine (5 - 2023-24 season) 08/01/2023 (Originally 06/12/2022)   INFLUENZA VACCINE  01/10/2024 (Originally 05/13/2023)   Pneumonia Vaccine 22+ Years old (1 of 1 - PCV) 03/31/2024 (Originally 02/26/2021)   Hepatitis C Screening  05/30/2024 (Originally 02/26/1974)   OPHTHALMOLOGY EXAM  08/14/2023   HEMOGLOBIN A1C   09/01/2023   Diabetic kidney evaluation - eGFR measurement  03/31/2024   FOOT EXAM  03/31/2024   Medicare Annual Wellness (AWV)  05/30/2024   DTaP/Tdap/Td (2 - Td or Tdap) 05/28/2028   Colonoscopy  11/11/2028   Zoster Vaccines- Shingrix  Completed   HPV VACCINES  Aged Out       Assessment  This is a routine wellness examination for Jamie Rice.  Health Maintenance: Due or Overdue There are no preventive care reminders to display for this patient.   Jamie Rice does not need a referral for [employer] Assistance: Care Management:   no Social Work:    no Prescription Assistance:  no Nutrition/Diabetes Education:  no   Plan:  Personalized Goals  Goals Addressed               This Visit's Progress     Patient Stated (pt-stated)        Patient stated that he would like to get off all of his medications       Personalized Health Maintenance & Screening Recommendations  Pneumococcal vaccine  Urine ACR Influenza vaccine  Lung Cancer Screening Recommended: no (Low Dose CT Chest recommended if Age 61-80 years, 20 pack-year currently smoking OR have quit w/in past 15 years) Hepatitis C Screening recommended: no HIV Screening recommended: no  Advanced Directives: Written information was not prepared per patient's request.  Referrals & Orders No orders of the defined types were placed in this encounter.   Follow-up Plan Follow-up with Everrett Coombe, DO as planned Schedule pneumonia vaccine and influenza vaccine.  Medicare wellness visit in one year.  AVS printed and mailed to the patient.    I have personally reviewed and noted the following in the patient's chart:   Medical and social history Use of alcohol, tobacco or illicit drugs  Current medications and supplements Functional ability and status Nutritional status Physical activity Advanced directives List of other physicians Hospitalizations, surgeries, and ER visits in previous 12  months Vitals Screenings to include cognitive, depression, and falls Referrals and appointments  In addition, I have reviewed and discussed with Jamie Rice certain preventive protocols, quality metrics, and best practice recommendations. A written personalized care plan for preventive services as well as general preventive health recommendations is available and can be mailed to the patient at his request.      Modesto Charon, RN BSN  05/31/2023

## 2023-05-31 NOTE — Patient Instructions (Addendum)
MEDICARE ANNUAL WELLNESS VISIT Health Maintenance Summary and Written Plan of Care  Mr. Jamie Rice ,  Thank you for allowing me to perform your Medicare Annual Wellness Visit and for your ongoing commitment to your health.   Health Maintenance & Immunization History Health Maintenance  Topic Date Due  . Diabetic kidney evaluation - Urine ACR  06/01/2023 (Originally 02/26/1974)  . COVID-19 Vaccine (5 - 2023-24 season) 08/01/2023 (Originally 06/12/2022)  . INFLUENZA VACCINE  01/10/2024 (Originally 05/13/2023)  . Pneumonia Vaccine 94+ Years old (1 of 1 - PCV) 03/31/2024 (Originally 02/26/2021)  . Hepatitis C Screening  05/30/2024 (Originally 02/26/1974)  . OPHTHALMOLOGY EXAM  08/14/2023  . HEMOGLOBIN A1C  09/01/2023  . Diabetic kidney evaluation - eGFR measurement  03/31/2024  . FOOT EXAM  03/31/2024  . Medicare Annual Wellness (AWV)  05/30/2024  . DTaP/Tdap/Td (2 - Td or Tdap) 05/28/2028  . Colonoscopy  11/11/2028  . Zoster Vaccines- Shingrix  Completed  . HPV VACCINES  Aged Out   Immunization History  Administered Date(s) Administered  . Fluad Quad(high Dose 67+) 07/29/2021  . Influenza,inj,Quad PF,6+ Mos 08/02/2017  . PFIZER(Purple Top)SARS-COV-2 Vaccination 12/20/2019, 01/10/2020, 07/27/2020  . Research officer, trade union 58yrs & up 07/29/2021  . Tdap 05/28/2018  . Zoster Recombinant(Shingrix) 03/19/2022, 05/11/2022    These are the patient goals that we discussed:  Goals Addressed              This Visit's Progress   .  Patient Stated (pt-stated)        Patient stated that he would like to get off all of his medications        This is a list of Health Maintenance Items that are overdue or due now: Pneumococcal vaccine  Urine ACR Influenza vaccine  Orders/Referrals Placed Today: No orders of the defined types were placed in this encounter.  (Contact our referral department at [phone] if you have not spoken with someone about your referral  appointment within the next 5 days)    Follow-up Plan Follow-up with Everrett Coombe, DO as planned Schedule pneumonia vaccine and influenza vaccine.  Medicare wellness visit in one year.  AVS printed and mailed to the patient.       Health Maintenance, Male Adopting a healthy lifestyle and getting preventive care are important in promoting health and wellness. Ask your health care provider about: The right schedule for you to have regular tests and exams. Things you can do on your own to prevent diseases and keep yourself healthy. What should I know about diet, weight, and exercise? Eat a healthy diet  Eat a diet that includes plenty of vegetables, fruits, low-fat dairy products, and lean protein. Do not eat a lot of foods that are high in solid fats, added sugars, or sodium. Maintain a healthy weight Body mass index (BMI) is a measurement that can be used to identify possible weight problems. It estimates body fat based on height and weight. Your health care provider can help determine your BMI and help you achieve or maintain a healthy weight. Get regular exercise Get regular exercise. This is one of the most important things you can do for your health. Most adults should: Exercise for at least 150 minutes each week. The exercise should increase your heart rate and make you sweat (moderate-intensity exercise). Do strengthening exercises at least twice a week. This is in addition to the moderate-intensity exercise. Spend less time sitting. Even light physical activity can be beneficial. Watch cholesterol and blood lipids  Have your blood tested for lipids and cholesterol at 67 years of age, then have this test every 5 years. You may need to have your cholesterol levels checked more often if: Your lipid or cholesterol levels are high. You are older than 67 years of age. You are at high risk for heart disease. What should I know about cancer screening? Many types of cancers can be  detected early and may often be prevented. Depending on your health history and family history, you may need to have cancer screening at various ages. This may include screening for: Colorectal cancer. Prostate cancer. Skin cancer. Lung cancer. What should I know about heart disease, diabetes, and high blood pressure? Blood pressure and heart disease High blood pressure causes heart disease and increases the risk of stroke. This is more likely to develop in people who have high blood pressure readings or are overweight. Talk with your health care provider about your target blood pressure readings. Have your blood pressure checked: Every 3-5 years if you are 47-78 years of age. Every year if you are 60 years old or older. If you are between the ages of 72 and 71 and are a current or former smoker, ask your health care provider if you should have a one-time screening for abdominal aortic aneurysm (AAA). Diabetes Have regular diabetes screenings. This checks your fasting blood sugar level. Have the screening done: Once every three years after age 37 if you are at a normal weight and have a low risk for diabetes. More often and at a younger age if you are overweight or have a high risk for diabetes. What should I know about preventing infection? Hepatitis B If you have a higher risk for hepatitis B, you should be screened for this virus. Talk with your health care provider to find out if you are at risk for hepatitis B infection. Hepatitis C Blood testing is recommended for: Everyone born from [birth year] through 1965. Anyone with known risk factors for hepatitis C. Sexually transmitted infections (STIs) You should be screened each year for STIs, including gonorrhea and chlamydia, if: You are sexually active and are younger than 67 years of age. You are older than 67 years of age and your health care provider tells you that you are at risk for this type of infection. Your sexual activity has changed  since you were last screened, and you are at increased risk for chlamydia or gonorrhea. Ask your health care provider if you are at risk. Ask your health care provider about whether you are at high risk for HIV. Your health care provider may recommend a prescription medicine to help prevent HIV infection. If you choose to take medicine to prevent HIV, you should first get tested for HIV. You should then be tested every 3 months for as long as you are taking the medicine. Follow these instructions at home: Alcohol use Do not drink alcohol if your health care provider tells you not to drink. If you drink alcohol: Limit how much you have to 0-2 drinks a day. Know how much alcohol is in your drink. In the [place] one drink equals one 12 oz bottle of beer (355 mL), one 5 oz glass of wine (148 mL), or one 1 oz glass of hard liquor (44 mL). Lifestyle Do not use any products that contain nicotine or tobacco. These products include cigarettes, chewing tobacco, and vaping devices, such as e-cigarettes. If you need help quitting, ask your health care provider. Do not use  street drugs. Do not share needles. Ask your health care provider for help if you need support or information about quitting drugs. General instructions Schedule regular health, dental, and eye exams. Stay current with your vaccines. Tell your health care provider if: You often feel depressed. You have ever been abused or do not feel safe at home. Summary Adopting a healthy lifestyle and getting preventive care are important in promoting health and wellness. Follow your health care provider's instructions about healthy diet, exercising, and getting tested or screened for diseases. Follow your health care provider's instructions on monitoring your cholesterol and blood pressure. This information is not intended to replace advice given to you by your health care provider. Make sure you discuss any questions you have with your health care  provider. Document Revised: 02/17/2021 Document Reviewed: 02/17/2021 Elsevier Patient Education  2024 [employer].

## 2023-06-09 ENCOUNTER — Encounter: Payer: Self-pay | Admitting: Family Medicine

## 2023-06-09 ENCOUNTER — Ambulatory Visit: Payer: PPO | Admitting: Family Medicine

## 2023-06-09 VITALS — BP 109/65 | HR 58 | Ht 72.0 in | Wt 178.0 lb

## 2023-06-09 DIAGNOSIS — R222 Localized swelling, mass and lump, trunk: Secondary | ICD-10-CM | POA: Insufficient documentation

## 2023-06-09 DIAGNOSIS — Z7984 Long term (current) use of oral hypoglycemic drugs: Secondary | ICD-10-CM | POA: Diagnosis not present

## 2023-06-09 DIAGNOSIS — Z23 Encounter for immunization: Secondary | ICD-10-CM

## 2023-06-09 DIAGNOSIS — E1169 Type 2 diabetes mellitus with other specified complication: Secondary | ICD-10-CM | POA: Diagnosis not present

## 2023-06-09 LAB — POCT UA - MICROALBUMIN
Creatinine, POC: 50 mg/dL
Microalbumin Ur, POC: 10 mg/L

## 2023-06-09 NOTE — Progress Notes (Signed)
JANIE Rice - 67 y.o. male MRN [id number]  Date of birth: 06/21/1956  Subjective Chief Complaint  Patient presents with   Mass    HPI Jamie Rice is a 67 y.o. male here today with complaint of nodule over the left lower chest wall.  Noticed the other day.  Area is not painful.  Doesn't think that it has changed in size since he noticed this.  He has not noted any other symptoms.   ROS:  A comprehensive ROS was completed and negative except as noted per HPI  No Known Allergies  Past Medical History:  Diagnosis Date   Anxiety    Arthritis    Back pain    CAD (coronary artery disease)    Depression    HTN (hypertension)    Hypercholesteremia     Past Surgical History:  Procedure Laterality Date   HERNIA REPAIR     PTCA with stent - [facility]     VASECTOMY      Social History   Socioeconomic History   Marital status: Widowed    Spouse name: Not on file   Number of children: 2   Years of education: 14   Highest education level: Some college, no degree  Occupational History   Occupation: Delivery    Comment: Part-time   Occupation: Retired  Tobacco Use   Smoking status: Former   Smokeless tobacco: Never  Advertising account planner   Vaping status: Never Used  Substance and Sexual Activity   Alcohol use: Yes    Comment: rarely   Drug use: No   Sexual activity: Not on file  Other Topics Concern   Not on file  Social History Narrative   Lives alone. He has two children. He enjoys cooking and bowling.   Social Determinants of Health   Financial Resource Strain: Low Risk  (05/27/2023)   Overall Financial Resource Strain (CARDIA)    Difficulty of Paying Living Expenses: Not very hard  Food Insecurity: No Food Insecurity (05/27/2023)   Hunger Vital Sign    Worried About Running Out of Food in the Last Year: Never true    Ran Out of Food in the Last Year: Never true  Transportation Needs: No Transportation Needs (05/27/2023)   PRAPARE - Scientist, research (physical sciences) (Medical): No    Lack of Transportation (Non-Medical): No  Physical Activity: Sufficiently Active (05/27/2023)   Exercise Vital Sign    Days of Exercise per Week: 5 days    Minutes of Exercise per Session: 40 min  Stress: Stress Concern Present (01/03/2023)   [employer] of Occupational Health - Occupational Stress Questionnaire    Feeling of Stress : Rather much  Social Connections: Socially Isolated (05/31/2023)   Social Connection and Isolation Panel [NHANES]    Frequency of Communication with Friends and Family: Twice a week    Frequency of Social Gatherings with Friends and Family: Twice a week    Attends Religious Services: Never    Database administrator or Organizations: No    Attends Banker Meetings: Never    Marital Status: Widowed    Family History  Problem Relation Age of Onset   Heart disease Father    Heart attack Father    Hypertension Father    Stroke Father        X4   Hypertension Paternal Uncle        X4   Stroke Paternal Uncle     Health Maintenance  Topic Date Due   Diabetic kidney evaluation - Urine ACR  Never done   COVID-19 Vaccine (5 - 2023-24 season) 08/01/2023 (Originally 06/12/2022)   INFLUENZA VACCINE  01/10/2024 (Originally 05/13/2023)   Pneumonia Vaccine 83+ Years old (1 of 1 - PCV) 03/31/2024 (Originally 02/26/2021)   Hepatitis C Screening  05/30/2024 (Originally 02[birth year]26/1974)   OPHTHALMOLOGY EXAM  08/14/2023   HEMOGLOBIN A1C  09/01/2023   Diabetic kidney evaluation - eGFR measurement  03/31/2024   FOOT EXAM  03/31/2024   Medicare Annual Wellness (AWV)  05/30/2024   DTaP/Tdap/Td (2 - Td or Tdap) 05/28/2028   Colonoscopy  11/11/2028   Zoster Vaccines- Shingrix  Completed   HPV VACCINES  Aged Out      ----------------------------------------------------------------------------------------------------------------------------------------------------------------------------------------------------------------- Physical Exam BP 109/65 (BP Location: Right Arm, Patient Position: Sitting, Cuff Size: Normal)   Pulse (!) 58   Ht 6' (1.829 m)   Wt 178 lb (80.7 kg)   SpO2 98%   BMI 24.14 kg/m   Physical Exam Constitutional:      Appearance: Normal appearance.  Cardiovascular:     Rate and Rhythm: Normal rate and regular rhythm.  Pulmonary:     Effort: Pulmonary effort is normal.     Breath sounds: Normal breath sounds.  Neurological:     Mental Status: He is alert.     ------------------------------------------------------------------------------------------------------------------------------------------------------------------------------------------------------------------- Assessment and Plan  Nodule of left anterior chest wall He has 3 subcutaneous nodules along the L abdominal wall extending into the lower chest wall.  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  [place] ordered for further evaluation.     No orders of the defined types were placed in this encounter.   No follow-ups on file.    This visit occurred during the SARS-CoV-2 public health emergency.  Safety protocols were in place, including screening questions prior to the visit, additional usage of staff PPE, and extensive cleaning of exam room while observing appropriate contact time as indicated for disinfecting solutions.

## 2023-06-09 NOTE — Assessment & Plan Note (Signed)
He has 3 subcutaneous nodules along the L abdominal wall extending into the lower chest wall.  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  [place] ordered for further evaluation.

## 2023-06-11 ENCOUNTER — Ambulatory Visit: Payer: PPO

## 2023-06-11 ENCOUNTER — Ambulatory Visit (INDEPENDENT_AMBULATORY_CARE_PROVIDER_SITE_OTHER): Payer: PPO

## 2023-06-11 DIAGNOSIS — R222 Localized swelling, mass and lump, trunk: Secondary | ICD-10-CM

## 2023-06-14 ENCOUNTER — Encounter: Payer: Self-pay | Admitting: Family Medicine

## 2023-06-14 DIAGNOSIS — I2510 Atherosclerotic heart disease of native coronary artery without angina pectoris: Secondary | ICD-10-CM

## 2023-06-15 NOTE — Telephone Encounter (Signed)
 Updated referral entered.   CM

## 2023-06-15 NOTE — Telephone Encounter (Signed)
Pended referral

## 2023-06-22 NOTE — Telephone Encounter (Signed)
Pt called. He states that Cardiologist is not accepting new patients until December and he cannot wait that long. Can pcp recommend another Cardiologist.

## 2023-06-22 NOTE — Telephone Encounter (Signed)
Left a message for a return call.

## 2023-07-04 NOTE — Progress Notes (Incomplete)
Cardiology Office Note   Date:  07/04/2023   ID:  Jamie, Rice 08/18/1956, MRN [id number]  PCP:  Everrett Coombe, DO    No chief complaint on file.  CAD  Wt Readings from Last 3 Encounters:  06/09/23 178 lb (80.7 kg)  04/28/23 181 lb (82.1 kg)  04/01/23 182 lb (82.6 kg)       History of Present Illness: Jamie Rice is a 67 y.o. 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  He had a bare metal stent placed and did well.  He had no chest pain.  He felt hot and sweaty.  No SHOB.  He was taken to [facility] and had an emergency cath. No sx like his MI since that time.      Records from [facility] reviewed. He had a 4.0 x 20 bare-metal stent to the right coronary artery in October 2014. He had inferior and apical hypokinesis. Ejection fraction was preserved.    He had hip replacement surgery in 11/15.  He did well with that.    His father had CABG 30+ years ago, and is doing well in his 60s.   In the past, it was noted: "He swims and walks daily, in good weather.  He does not do a lot of weight lifting due to shoulder problems.  He will need left shoulder surgery."  He  postpondc this operation for some time.    He had an operation for Bakers cyst and torn cartilage.  Outpatient surgery in 2021- improved   Back on Plavix.   Walks daily.      Past Medical History:  Diagnosis Date  . Anxiety   . Arthritis   . Back pain   . CAD (coronary artery disease)   . Depression   . HTN (hypertension)   . Hypercholesteremia     Past Surgical History:  Procedure Laterality Date  . HERNIA REPAIR    . PTCA with stent - [facility]    . VASECTOMY       Current Outpatient Medications  Medication Sig Dispense Refill  . atorvastatin (LIPITOR) 80 MG tablet TAKE ONE TABLET BY MOUTH ONE TIME DAILY 90 tablet 3  . Cinnamon 500 MG capsule Take 500 mg by mouth 2 (two) times daily.    . clopidogrel (PLAVIX) 75 MG tablet Take 1 tablet (75 mg total) by mouth daily. 90  tablet 0  . Cyanocobalamin (B-12 PO) Take 1,000 mcg by mouth.    . enalapril (VASOTEC) 10 MG tablet TAKE ONE TABLET BY MOUTH ONE TIME DAILY 90 tablet 3  . GARLIC PO Take 5,643 mg by mouth.    Marland Kitchen GINSENG PO Take 2,250 mg by mouth.    Marland Kitchen GLUCOSAMINE-CHONDROITIN PO Take by mouth.    . Magnesium 400 MG CAPS Take by mouth.    . nitroGLYCERIN (NITROSTAT) 0.4 MG SL tablet Place 1 tablet (0.4 mg total) under the tongue every 5 (five) minutes as needed for chest pain. 25 tablet 6  . NON FORMULARY Take 1 capsule by mouth every morning. TUMERIC    . Omega-3 Fatty Acids (FISH OIL PO) Take 1,200 mg by mouth 2 (two) times daily.     Marland Kitchen OVER THE COUNTER MEDICATION Take 1,000 mg by mouth. HMB supplement    . vitamin k 100 MCG tablet Take 100 mcg by mouth daily.    Marland Kitchen XIGDUO XR 07-999 MG TB24 TAKE ONE TABLET BY MOUTH ONE TIME DAILY 90 tablet 0  No current facility-administered medications for this visit.    Allergies:   Patient has no known allergies.    Social History:  The patient  reports that he has quit smoking. He has never used smokeless tobacco. He reports current alcohol use. He reports that he does not use drugs.   Family History:  The patient's ***family history includes Heart attack in his father; Heart disease in his father; Hypertension in his father and paternal uncle; Stroke in his father and paternal uncle.    ROS:  Please see the history of present illness.   Otherwise, review of systems are positive for ***.   All other systems are reviewed and negative.    PHYSICAL EXAM: VS:  There were no vitals taken for this visit. , BMI There is no height or weight on file to calculate BMI. GEN: Well nourished, well developed, in no acute distress HEENT: normal Neck: no JVD, carotid bruits, or masses Cardiac: ***RRR; no murmurs, rubs, or gallops,no edema  Respiratory:  clear to auscultation bilaterally, normal work of breathing GI: soft, nontender, nondistended, + BS MS: no deformity or  atrophy Skin: warm and dry, no rash Neuro:  Strength and sensation are intact Psych: euthymic mood, full affect   EKG:   The ekg ordered today demonstrates ***   Recent Labs: 03/01/2023: ALT 22; Hemoglobin 17.0; Platelets 322 04/01/2023: BUN 18; Creat 1.09; Magnesium 2.1; Potassium 4.3; Sodium 140; TSH 3.34   Lipid Panel    Component Value Date/Time   CHOL 172 03/01/2023 1056   TRIG 202 (H) 03/01/2023 1056   HDL 57 03/01/2023 1056   CHOLHDL 3.0 03/01/2023 1056   VLDL 33.4 10/03/2014 0734   LDLCALC 86 03/01/2023 1056   LDLDIRECT 123.6 07/04/2014 0829     Other studies Reviewed: Additional studies/ records that were reviewed today with results demonstrating: ***.   ASSESSMENT AND PLAN:  CAD/Old MI: Continue aggressive second evaluation. Hypertension: Hyperlipidemia: Diabetes: Exercise target as noted below.  Decreased processed foods.  Avoid processed sugars.  Whole food, plant-based diet. No AAA by ultrasound in 2022.  Former smoker.   Current medicines are reviewed at length with the patient today.  The patient concerns regarding his medicines were addressed.  The following changes have been made:  No change***  Labs/ tests ordered today include: *** No orders of the defined types were placed in this encounter.   Recommend 150 minutes/week of aerobic exercise Low fat, low carb, high fiber diet recommended  Disposition:   FU in ***   Signed, Lance Muss, MD  07/04/2023 11:36 PM    [facility] [address] [place], [place], [place]  [address] Phone: [phone]; Fax: [phone]

## 2023-07-04 NOTE — Progress Notes (Signed)
Cardiology Office Note   Date:  07/05/2023   ID:  Jamie Rice, Jamie Rice 30-Sep-1956, MRN [id number]  PCP:  Everrett Coombe, DO    No chief complaint on file.  CAD  Wt Readings from Last 3 Encounters:  07/05/23 177 lb (80.3 kg)  06/09/23 178 lb (80.7 kg)  04/28/23 181 lb (82.1 kg)       History of Present Illness: Jamie Rice is a 67 y.o. 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  He had a bare metal stent placed and did well.  He had no chest pain.  He felt hot and sweaty.  No SHOB.  He was taken to [facility] and had an emergency cath. No sx like his MI since that time.      Records from [facility] reviewed. He had a 4.0 x 20 bare-metal stent to the right coronary artery in October 2014. He had inferior and apical hypokinesis. Ejection fraction was preserved.    He had hip replacement surgery in 11/15.  He did well with that.    His father had CABG 30+ years ago, and is doing well in his 78s.   In the past, it was noted: "He swims and walks daily, in good weather.  He does not do a lot of weight lifting due to shoulder problems.  He will need left shoulder surgery."  He  postponed this operation for some time.    He had an operation for Bakers cyst and torn cartilage.  Outpatient surgery in 2021- improved   Back on Plavix.   Walks daily.    Now waking up with numbness in either arm, and lower part of mouth.  This is different from his prior angina.   He walks regularly, 4x/week.    Denies : Chest pain. Dizziness. Leg edema. Nitroglycerin use. Orthopnea. Palpitations. Paroxysmal nocturnal dyspnea. Syncope.    Numbness described above has been concerning and he is worried this is a heart issue.  Past Medical History:  Diagnosis Date   Anxiety    Arthritis    Back pain    CAD (coronary artery disease)    Depression    HTN (hypertension)    Hypercholesteremia     Past Surgical History:  Procedure Laterality Date   HERNIA REPAIR     PTCA with stent  - [facility]     VASECTOMY       Current Outpatient Medications  Medication Sig Dispense Refill   atorvastatin (LIPITOR) 80 MG tablet TAKE ONE TABLET BY MOUTH ONE TIME DAILY 90 tablet 3   Cinnamon 500 MG capsule Take 500 mg by mouth 2 (two) times daily.     clopidogrel (PLAVIX) 75 MG tablet Take 1 tablet (75 mg total) by mouth daily. 90 tablet 0   Cyanocobalamin (B-12 PO) Take 1,000 mcg by mouth.     enalapril (VASOTEC) 10 MG tablet TAKE ONE TABLET BY MOUTH ONE TIME DAILY 90 tablet 3   GARLIC PO Take 6,644 mg by mouth.     GINSENG PO Take 2,250 mg by mouth.     Magnesium 400 MG CAPS Take by mouth.     nitroGLYCERIN (NITROSTAT) 0.4 MG SL tablet Place 1 tablet (0.4 mg total) under the tongue every 5 (five) minutes as needed for chest pain. 25 tablet 6   Omega-3 Fatty Acids (FISH OIL PO) Take 1,200 mg by mouth 2 (two) times daily.      OVER THE COUNTER MEDICATION Take 1,000 mg  by mouth. HMB supplement     vitamin k 100 MCG tablet Take 100 mcg by mouth daily.     XIGDUO XR 07-999 MG TB24 TAKE ONE TABLET BY MOUTH ONE TIME DAILY 90 tablet 0   No current facility-administered medications for this visit.    Allergies:   Patient has no known allergies.    Social History:  The patient  reports that he has quit smoking. He has never used smokeless tobacco. He reports current alcohol use. He reports that he does not use drugs.   Family History:  The patient's family history includes Heart attack in his father; Heart disease in his father; Hypertension in his father and paternal uncle; Stroke in his father and paternal uncle.    ROS:  Please see the history of present illness.   Otherwise, review of systems are positive for numbness in left arm, increasing in frequency.   All other systems are reviewed and negative.    PHYSICAL EXAM: VS:  BP 126/64   Pulse (!) 57   Ht 5' 11.5" (1.816 m)   Wt 177 lb (80.3 kg)   SpO2 97%   BMI 24.34 kg/m  , BMI Body mass index is 24.34 kg/m. GEN: Well  nourished, well developed, in no acute distress HEENT: normal Neck: no JVD, carotid bruits, or masses Cardiac: RRR; no murmurs, rubs, or gallops,no edema  Respiratory:  clear to auscultation bilaterally, normal work of breathing GI: soft, nontender, nondistended, + BS MS: no deformity or atrophy Skin: warm and dry, no rash Neuro:  Strength and sensation are intact Psych: euthymic mood, full affect   EKG:   The ekg ordered today demonstrates sinus bradycardia, nonspecific ST-T wave changes noted on this ECG   Recent Labs: 03/01/2023: ALT 22; Hemoglobin 17.0; Platelets 322 04/01/2023: BUN 18; Creat 1.09; Magnesium 2.1; Potassium 4.3; Sodium 140; TSH 3.34   Lipid Panel    Component Value Date/Time   CHOL 172 03/01/2023 1056   TRIG 202 (H) 03/01/2023 1056   HDL 57 03/01/2023 1056   CHOLHDL 3.0 03/01/2023 1056   VLDL 33.4 10/03/2014 0734   LDLCALC 86 03/01/2023 1056   LDLDIRECT 123.6 07/04/2014 0829     Other studies Reviewed: Additional studies/ records that were reviewed today with results demonstrating: labs reviewed.   ASSESSMENT AND PLAN:  CAD/Old MI: Prior inferior MI with RCA stent in 2014.  Continue aggressive secondary prevention.  Vague sx of arm numbness, more on the left.  Will plan for nuclear stress test given that stent was 10 years ago.  He will try to walk the treadmill.  I think ETT would be difficult to interpret given some of the nonspecific ST and T wave changes that he has. Hypertension: The current medical regimen is effective;  continue present plan and medications.  Runs normal at home as well.   Hyperlipidemia: May 2024 total cholesterol 172 HDL 57 LDL 86 triglycerides 202.  Start Zetia 10 mg daily and check lipids in 2-3 months.  Target LDL 55. Diabetes: Exercise target as noted below.  Decreased processed foods.  Avoid processed sugars.  Whole food, plant-based diet. No AAA by ultrasound in 2022.  Former smoker quit many years ago.   Current medicines  are reviewed at length with the patient today.  The patient concerns regarding his medicines were addressed.  The following changes have been made:  Add zetia  Labs/ tests ordered today include: Liver lipids in 3 months  Orders Placed This Encounter  Procedures  EKG 12-Lead    Recommend 150 minutes/week of aerobic exercise Low fat, low carb, high fiber diet recommended  Disposition:   FU for stress test, 6 months with Dr. Anne Fu, Rennis Harding or [place],   Signed, Lance Muss, MD  07/05/2023 9:23 AM    [facility] [address] [place], [place], [place]  [address] Phone: [phone]; Fax: [phone]

## 2023-07-05 ENCOUNTER — Ambulatory Visit: Payer: PPO | Attending: Interventional Cardiology | Admitting: Interventional Cardiology

## 2023-07-05 ENCOUNTER — Encounter: Payer: Self-pay | Admitting: *Deleted

## 2023-07-05 ENCOUNTER — Encounter: Payer: Self-pay | Admitting: Interventional Cardiology

## 2023-07-05 VITALS — BP 126/64 | HR 57 | Ht 71.5 in | Wt 177.0 lb

## 2023-07-05 DIAGNOSIS — I10 Essential (primary) hypertension: Secondary | ICD-10-CM

## 2023-07-05 DIAGNOSIS — I252 Old myocardial infarction: Secondary | ICD-10-CM | POA: Diagnosis not present

## 2023-07-05 DIAGNOSIS — I2510 Atherosclerotic heart disease of native coronary artery without angina pectoris: Secondary | ICD-10-CM

## 2023-07-05 DIAGNOSIS — Z87891 Personal history of nicotine dependence: Secondary | ICD-10-CM | POA: Diagnosis not present

## 2023-07-05 DIAGNOSIS — E782 Mixed hyperlipidemia: Secondary | ICD-10-CM

## 2023-07-05 MED ORDER — EZETIMIBE 10 MG PO TABS: 10.0000 mg | ORAL_TABLET | Freq: Every day | ORAL | 3 refills | Status: DC

## 2023-07-05 NOTE — Patient Instructions (Signed)
Medication Instructions:  Your physician has recommended you make the following change in your medication: Start Ezetimibe 10 mg by mouth daily   *If you need a refill on your cardiac medications before your next appointment, please call your pharmacy*   Lab Work: Your physician recommends that you return for lab work in: 3 months--Lipid and liver profiles. This will be fasting  If you have labs (blood work) drawn today and your tests are completely normal, you will receive your results only by: MyChart Message (if you have MyChart) OR A paper copy in the mail If you have any lab test that is abnormal or we need to change your treatment, we will call you to review the results.   Testing/Procedures: Your physician has requested that you have an exercise stress myoview. For further information please visit [email or web address] Please follow instruction sheet, as given.    Follow-Up: At [facility], you and your health needs are our priority.  As part of our continuing mission to provide you with exceptional heart care, we have created designated Provider Care Teams.  These Care Teams include your primary Cardiologist (physician) and Advanced Practice Providers (APPs -  Physician Assistants and Nurse Practitioners) who all work together to provide you with the care you need, when you need it.  We recommend signing up for the patient portal called "MyChart".  Sign up information is provided on this After Visit Summary.  MyChart is used to connect with patients for Virtual Visits (Telemedicine).  Patients are able to view lab/test results, encounter notes, upcoming appointments, etc.  Non-urgent messages can be sent to your provider as well.   To learn more about what you can do with MyChart, go to [email or web address].    Your next appointment:   6 month(s)  Provider:   Dr Izora Ribas, Dr Anne Fu, Dr Flora Lipps, Dr Bjorn Pippin   Other Instructions

## 2023-07-12 ENCOUNTER — Telehealth (HOSPITAL_COMMUNITY): Payer: Self-pay | Admitting: *Deleted

## 2023-07-12 NOTE — Telephone Encounter (Signed)
Patient given detailed instructions per Myocardial Perfusion Study Information Sheet for the test on 07/19/23 Patient notified to arrive 15 minutes early and that it is imperative to arrive on time for appointment to keep from having the test rescheduled.  If you need to cancel or reschedule your appointment, please call the office within 24 hours of your appointment. . Patient verbalized understanding.Ricky Ala

## 2023-07-18 ENCOUNTER — Encounter: Payer: Self-pay | Admitting: Pharmacist

## 2023-07-18 NOTE — Progress Notes (Signed)
Pharmacy Quality Measure Review  This patient is appearing on a report for being at risk of failing the adherence measure for cholesterol (statin) and hypertension (ACEi/ARB) medications this calendar year.   Medication: atorvastatin 80 mg  Last fill date: 04/08/23 for 90 day supply  Outreached patient to discuss adherence to atorvastatin. Stated that he had about 10-12 tablets left of atorvastatin currently. Patient admitted to missing doses. Denied issues getting medications from the pharmacy. Reported that he currently keeps his medications in the kitchen to remind himself to take them. Discussed the importance of taking medications every day and recommended using a pill box so he is easily able to see if he took his medications or not.   Medication: enalapril 10 mg Last fill date: 05/10/23 for 90 day supply  Insurance report was not up to date for enalapril. No action needed at this time.   Jarrett Ables, PharmD PGY-1 Pharmacy Resident

## 2023-07-18 NOTE — Progress Notes (Signed)
Pharmacy Quality Measure Review  This patient is appearing on a report for being at risk of failing the adherence measure for diabetes medications this calendar year.   Medication: Xigduo 07-999 mg Last fill date: 05/12/23 for 90 day supply  Insurance report was not up to date. No action needed at this time.   Jarrett Ables, PharmD PGY-1 Pharmacy Resident

## 2023-07-19 ENCOUNTER — Encounter (HOSPITAL_COMMUNITY): Payer: PPO

## 2023-07-21 ENCOUNTER — Telehealth (HOSPITAL_COMMUNITY): Payer: Self-pay | Admitting: *Deleted

## 2023-07-21 NOTE — Telephone Encounter (Signed)
Left message on voicemail per DPR in reference to upcoming appointment scheduled on 07/26/2023 at 7:30 with detailed instructions given per Myocardial Perfusion Study Information Sheet for the test. LM to arrive 15 minutes early, and that it is imperative to arrive on time for appointment to keep from having the test rescheduled. If you need to cancel or reschedule your appointment, please call the office within 24 hours of your appointment. Failure to do so may result in a cancellation of your appointment, and a $50 no show fee. Phone number given for call back for any questions.

## 2023-07-23 ENCOUNTER — Other Ambulatory Visit: Payer: Self-pay | Admitting: Family Medicine

## 2023-07-26 ENCOUNTER — Ambulatory Visit (HOSPITAL_COMMUNITY): Payer: PPO | Attending: Internal Medicine

## 2023-07-26 DIAGNOSIS — I2510 Atherosclerotic heart disease of native coronary artery without angina pectoris: Secondary | ICD-10-CM | POA: Diagnosis not present

## 2023-07-26 DIAGNOSIS — I252 Old myocardial infarction: Secondary | ICD-10-CM | POA: Diagnosis not present

## 2023-07-26 LAB — MYOCARDIAL PERFUSION IMAGING
Angina Index: 0
Duke Treadmill Score: 6
Estimated workload: 19
Exercise duration (min): 5 min
Exercise duration (sec): 45 s
LV dias vol: 82 mL (ref 62–150)
LV sys vol: 29 mL
MPHR: 153 {beats}/min
Nuc Stress EF: 65 %
Peak HR: 142 {beats}/min
Percent HR: 98 %
RPE: 19
Rest HR: 55 {beats}/min
Rest Nuclear Isotope Dose: 10.2 mCi
SDS: 2
SRS: 0
SSS: 2
ST Depression (mm): 0 mm
Stress Nuclear Isotope Dose: 32.9 mCi
TID: 1.04

## 2023-07-26 MED ORDER — TECHNETIUM TC 99M TETROFOSMIN IV KIT
10.2000 | PACK | Freq: Once | INTRAVENOUS | Status: AC | PRN
  Administered 2023-07-26: 10.2 via INTRAVENOUS

## 2023-07-26 MED ORDER — TECHNETIUM TC 99M TETROFOSMIN IV KIT
32.9000 | PACK | Freq: Once | INTRAVENOUS | Status: AC | PRN
  Administered 2023-07-26: 32.9 via INTRAVENOUS

## 2023-08-04 ENCOUNTER — Telehealth: Payer: Self-pay | Admitting: Cardiology

## 2023-08-04 NOTE — Telephone Encounter (Signed)
Patient would like a call back regarding stress test results. Please advise

## 2023-08-04 NOTE — Telephone Encounter (Signed)
Jamie Bathe, MD  Bertram Millard, [place] minutes ago (10:53 AM)   Excellent stress test Normal   Per last OV note with JV on 07/05/23: CAD/Old MI: Prior inferior MI with RCA stent in 2014.  Continue aggressive secondary prevention.  Vague sx of arm numbness, more on the left.  Will plan for nuclear stress test given that stent was 10 years ago.  He will try to walk the treadmill.  I think ETT would be difficult to interpret given some of the nonspecific ST and T wave changes that he has.  Patient made aware of results and reminded of upcoming visit in March with [place].

## 2023-08-04 NOTE — Telephone Encounter (Signed)
Pt advised that I will send to Dr Anne Fu for his review.. but to put his mind at ease nothing abnormal stands out to me from a nurse perspective.. but I will reach back out to him after the MD reviews.

## 2023-08-05 ENCOUNTER — Other Ambulatory Visit: Payer: Self-pay | Admitting: Interventional Cardiology

## 2023-08-07 ENCOUNTER — Other Ambulatory Visit: Payer: Self-pay | Admitting: Family Medicine

## 2023-08-22 NOTE — Progress Notes (Unsigned)
Pharmacy Quality Measure Review  This patient is appearing on a report for being at risk of failing the adherence measure for diabetes medications this calendar year.   Medication: Xigduo 10-1000mg  daily Last fill date: 05/12/23 for 90 day supply  {adherenceinterventions:31257}

## 2023-09-02 ENCOUNTER — Other Ambulatory Visit: Payer: Self-pay | Admitting: Interventional Cardiology

## 2023-09-14 ENCOUNTER — Encounter: Payer: Self-pay | Admitting: Family Medicine

## 2023-09-14 ENCOUNTER — Ambulatory Visit: Payer: PPO

## 2023-09-14 ENCOUNTER — Ambulatory Visit (INDEPENDENT_AMBULATORY_CARE_PROVIDER_SITE_OTHER): Payer: PPO | Admitting: Family Medicine

## 2023-09-14 VITALS — BP 110/63 | HR 58 | Ht 71.5 in | Wt 177.0 lb

## 2023-09-14 DIAGNOSIS — Z23 Encounter for immunization: Secondary | ICD-10-CM

## 2023-09-14 DIAGNOSIS — M19011 Primary osteoarthritis, right shoulder: Secondary | ICD-10-CM | POA: Diagnosis not present

## 2023-09-14 DIAGNOSIS — R200 Anesthesia of skin: Secondary | ICD-10-CM | POA: Insufficient documentation

## 2023-09-14 DIAGNOSIS — I2510 Atherosclerotic heart disease of native coronary artery without angina pectoris: Secondary | ICD-10-CM | POA: Diagnosis not present

## 2023-09-14 DIAGNOSIS — M25511 Pain in right shoulder: Secondary | ICD-10-CM

## 2023-09-14 NOTE — Progress Notes (Signed)
Who recently still Jamie Rice - 67 y.o. male MRN [id number]  Date of birth: November 09, 1955  Subjective Chief Complaint  Patient presents with   Immunizations   extremity numbness    HPI BANYAN TRACHT is a 67 y.o. male here today for follow-up.  He has stress test back in October due to history of coronary artery disease and possible anginal equivalent symptoms.  It appears that his cardiologist moved out of the area and he never heard results on this.  I did review these with him today which showed low risk and normal LV perfusion.  LV function is normal.  Per result note he was already scheduled with Dr. Anne Fu but he is unaware of any appointment.  Since discontinuing beet root supplement he has not had any further palpitations or flushing.  He is having some numbness and tingling in bilateral hands.  This comes and goes and tends to be worse at night.  Denies changes in grip strength.  He has not had neck pain.  He does have some pain in the right shoulder around the [facility] joint.  Does not recall any injury to this area.  ROS:  A comprehensive ROS was completed and negative except as noted per HPI  No Known Allergies  Past Medical History:  Diagnosis Date   Anxiety    Arthritis    Back pain    CAD (coronary artery disease)    Depression    HTN (hypertension)    Hypercholesteremia     Past Surgical History:  Procedure Laterality Date   HERNIA REPAIR     PTCA with stent - [place]     VASECTOMY      Social History   Socioeconomic History   Marital status: Widowed    Spouse name: Not on file   Number of children: 2   Years of education: 14   Highest education level: 12th grade  Occupational History   Occupation: Delivery    Comment: Part-time   Occupation: Retired  Tobacco Use   Smoking status: Former   Smokeless tobacco: Never  Advertising account planner   Vaping status: Never Used  Substance and Sexual Activity   Alcohol use: Yes    Comment: rarely   Drug use: No    Sexual activity: Not on file  Other Topics Concern   Not on file  Social History Narrative   Lives alone. He has two children. He enjoys cooking and bowling.   Social Determinants of Health   Financial Resource Strain: Low Risk  (09/10/2023)   Overall Financial Resource Strain (CARDIA)    Difficulty of Paying Living Expenses: Not very hard  Food Insecurity: No Food Insecurity (09/10/2023)   Hunger Vital Sign    Worried About Running Out of Food in the Last Year: Never true    Ran Out of Food in the Last Year: Never true  Transportation Needs: No Transportation Needs (09/10/2023)   PRAPARE - Administrator, Civil Service (Medical): No    Lack of Transportation (Non-Medical): No  Physical Activity: Sufficiently Active (09/10/2023)   Exercise Vital Sign    Days of Exercise per Week: 5 days    Minutes of Exercise per Session: 50 min  Stress: No Stress Concern Present (09/10/2023)   [employer] of Occupational Health - Occupational Stress Questionnaire    Feeling of Stress : Only a little  Social Connections: Unknown (09/10/2023)   Social Connection and Isolation Panel [NHANES]    Frequency of Communication  with Friends and Family: Three times a week    Frequency of Social Gatherings with Friends and Family: Three times a week    Attends Religious Services: Patient declined    Active Member of Clubs or Organizations: No    Attends Banker Meetings: Never    Marital Status: Widowed    Family History  Problem Relation Age of Onset   Heart disease Father    Heart attack Father    Hypertension Father    Stroke Father        X4   Hypertension Paternal Uncle        X4   Stroke Paternal Uncle     Health Maintenance  Topic Date Due   OPHTHALMOLOGY EXAM  08/14/2023   HEMOGLOBIN A1C  09/01/2023   Pneumonia Vaccine 24+ Years old (1 of 1 - PCV) 03/31/2024 (Originally 02/26/2021)   Hepatitis C Screening  05/30/2024 (Originally 02/26/1974)   COVID-19  Vaccine (6 - 2023-24 season) 01/13/2024   Diabetic kidney evaluation - eGFR measurement  03/31/2024   FOOT EXAM  03/31/2024   Medicare Annual Wellness (AWV)  05/30/2024   Diabetic kidney evaluation - Urine ACR  06/08/2024   DTaP/Tdap/Td (2 - Td or Tdap) 05/28/2028   Colonoscopy  11/11/2028   INFLUENZA VACCINE  Completed   Zoster Vaccines- Shingrix  Completed   HPV VACCINES  Aged Out     ----------------------------------------------------------------------------------------------------------------------------------------------------------------------------------------------------------------- Physical Exam BP 110/63 (BP Location: Left Arm, Patient Position: Sitting, Cuff Size: Normal)   Pulse (!) 58   Ht 5' 11.5" (1.816 m)   Wt 177 lb (80.3 kg)   SpO2 98%   BMI 24.34 kg/m   Physical Exam Constitutional:      Appearance: Normal appearance.  Eyes:     General: No scleral icterus. Cardiovascular:     Rate and Rhythm: Normal rate and regular rhythm.  Pulmonary:     Effort: Pulmonary effort is normal.     Breath sounds: Normal breath sounds.  Musculoskeletal:     Comments: Positive Phalen and median nerve compression test bilaterally.  No significant grip weakness.  Tenderness palpation along the right AC joint.  Neurological:     Mental Status: He is alert.  Psychiatric:        Mood and Affect: Mood normal.        Behavior: Behavior normal.     ------------------------------------------------------------------------------------------------------------------------------------------------------------------------------------------------------------------- Assessment and Plan  CAD (coronary artery disease) Denies continued symptoms.  Recent stress test was low risk without signs of LV ischemia or dysfunction.  Referral placed back to cardiology for continued management and monitoring of his CAD.  Bilateral hand numbness Symptoms seem consistent with bilateral carpal tunnel  syndrome.  Sending for nerve conduction.  Acute pain of right shoulder Likely with some arthritic changes of the [facility] joint.  X-rays ordered.   No orders of the defined types were placed in this encounter.   No follow-ups on file.    This visit occurred during the SARS-CoV-2 public health emergency.  Safety protocols were in place, including screening questions prior to the visit, additional usage of staff PPE, and extensive cleaning of exam room while observing appropriate contact time as indicated for disinfecting solutions.

## 2023-09-14 NOTE — Assessment & Plan Note (Signed)
Symptoms seem consistent with bilateral carpal tunnel syndrome.  Sending for nerve conduction.

## 2023-09-14 NOTE — Assessment & Plan Note (Addendum)
Denies continued symptoms.  Recent stress test was low risk without signs of LV ischemia or dysfunction.  Referral placed back to cardiology for continued management and monitoring of his CAD.

## 2023-09-14 NOTE — Assessment & Plan Note (Signed)
Likely with some arthritic changes of the [facility] joint.  X-rays ordered.

## 2023-09-23 ENCOUNTER — Encounter: Payer: Self-pay | Admitting: Family Medicine

## 2023-09-23 ENCOUNTER — Encounter: Payer: Self-pay | Admitting: Neurology

## 2023-09-23 NOTE — Telephone Encounter (Signed)
Jamie Rice said to disregard, she showed the referral coordinator where to look and how to link the order

## 2023-09-27 ENCOUNTER — Ambulatory Visit: Payer: PPO

## 2023-09-27 DIAGNOSIS — I2510 Atherosclerotic heart disease of native coronary artery without angina pectoris: Secondary | ICD-10-CM

## 2023-09-27 DIAGNOSIS — E782 Mixed hyperlipidemia: Secondary | ICD-10-CM | POA: Diagnosis not present

## 2023-09-27 LAB — HEPATIC FUNCTION PANEL
ALT: 19 [IU]/L (ref 0–44)
AST: 21 [IU]/L (ref 0–40)
Albumin: 4.3 g/dL (ref 3.9–4.9)
Alkaline Phosphatase: 86 [IU]/L (ref 44–121)
Bilirubin Total: 0.6 mg/dL (ref 0.0–1.2)
Bilirubin, Direct: 0.22 mg/dL (ref 0.00–0.40)
Total Protein: 6.2 g/dL (ref 6.0–8.5)

## 2023-09-27 LAB — LIPID PANEL
Chol/HDL Ratio: 2.8 {ratio} (ref 0.0–5.0)
Cholesterol, Total: 142 mg/dL (ref 100–199)
HDL: 51 mg/dL (ref >39)
LDL Chol Calc (NIH): 59 mg/dL (ref 0–99)
Triglycerides: 193 mg/dL — ABNORMAL HIGH (ref 0–149)
VLDL Cholesterol Cal: 32 mg/dL (ref 5–40)

## 2023-10-15 ENCOUNTER — Encounter: Payer: PPO | Admitting: Neurology

## 2023-10-20 ENCOUNTER — Other Ambulatory Visit: Payer: Self-pay | Admitting: Family Medicine

## 2023-11-04 ENCOUNTER — Ambulatory Visit: Payer: PPO | Admitting: Neurology

## 2023-11-04 DIAGNOSIS — R200 Anesthesia of skin: Secondary | ICD-10-CM | POA: Diagnosis not present

## 2023-11-04 DIAGNOSIS — G5602 Carpal tunnel syndrome, left upper limb: Secondary | ICD-10-CM

## 2023-11-04 DIAGNOSIS — M5412 Radiculopathy, cervical region: Secondary | ICD-10-CM

## 2023-11-04 NOTE — Procedures (Signed)
[facility] Neurology  [address], [address]  [place], [place] [address] Tel: [phone] Fax: [phone] Test Date:  11/04/2023  Patient: Jamie Rice DOB: 1955/12/25 Physician: Nita Sickle, DO  Sex: Male Height: 5\' 11"  Ref Phys: Everrett Coombe, DO  ID#: [id number]   Technician:    History: This is a 68 year old man referred for evaluation of bilateral arm paresthesias.  NCV & EMG Findings: Extensive electrodiagnostic testing of the right upper extremity and additional studies of the left shows:  Left median sensory response shows prolonged latency (4.5 ms).  Right median, right mixed palmar, and bilateral ulnar sensory responses are within normal limits. Left median motor response shows prolonged latency (4.7 ms).  Right median and bilateral ulnar motor responses are within normal limits.  Chronic motor axonal loss changes are seen affecting bilateral C5-6 myotomes, without accompanying active denervation.   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 Left median neuropathy at or distal to the wrist, consistent with a clinical diagnosis of carpal tunnel syndrome.  Overall, these findings are moderate in degree electrically.   ___________________________ Nita Sickle, DO    Nerve Conduction Studies   Stim Site NR Peak (ms) Norm Peak (ms) O-P Amp (V) Norm O-P Amp  Left Median Anti Sensory (2nd Digit)  32 C  Wrist    *4.5 <3.8 15.5 >10  Right Median Anti Sensory (2nd Digit)  32 C  Wrist    3.7 <3.8 19.2 >10  Left Ulnar Anti Sensory (5th Digit)  32 C  Wrist    2.8 <3.2 13.6 >5  Right Ulnar Anti Sensory (5th Digit)  32 C  Wrist    3.0 <3.2 18.9 >5     Stim Site NR Onset (ms) Norm Onset (ms) O-P Amp (mV) Norm O-P Amp Site1 Site2 Delta-0 (ms) Dist (cm) Vel (m/s) Norm Vel (m/s)  Left Median Motor (Abd Poll Brev)  32 C  Wrist    *4.7 <4.0 7.6 >5 Elbow Wrist 6.1 31.0 51 >50  Elbow    10.8   7.3         Right Median Motor (Abd Poll Brev)  32 C  Wrist    3.4 <4.0 6.7 >5 Elbow Wrist 6.3 32.0 51 >50  Elbow    9.7  6.3         Left Ulnar Motor (Abd Dig Minimi)  32 C  Wrist    2.5 <3.1 10.4 >7 B Elbow Wrist 4.4 23.0 52 >50  B Elbow    6.9  9.6  A Elbow B Elbow 1.9 10.0 53 >50  A Elbow    8.8  9.1         Right Ulnar Motor (Abd Dig Minimi)  32 C  Wrist    2.5 <3.1 9.1 >7 B Elbow Wrist 4.6 23.0 50 >50  B Elbow    7.1  8.5  A Elbow B Elbow 2.0 10.0 50 >50  A Elbow    9.1  8.1            Stim Site NR Peak (ms) Norm Peak (ms) P-T Amp (V) Site1 Site2 Delta-P (ms) Norm Delta (ms)  Right Median/Ulnar Palm Comparison (Wrist - 8cm)  32 C  Median Palm    2.2 <2.2 23.9 Median Palm Ulnar Palm 0.3   Ulnar Palm    1.9 <2.2 8.9       Electromyography   Side Muscle Ins.Act Fibs Fasc Recrt Amp Dur Poly  Activation Comment  Right 1stDorInt Nml Nml Nml Nml Nml Nml Nml Nml N/A  Right PronatorTeres Nml Nml Nml Nml Nml Nml Nml Nml N/A  Right Biceps Nml Nml Nml *2- *1+ *1+ *1+ Nml N/A  Right Triceps Nml Nml Nml Nml Nml Nml Nml Nml N/A  Right Deltoid Nml Nml Nml *2- *1+ *1+ *1+ Nml N/A  Left 1stDorInt Nml Nml Nml Nml Nml Nml Nml Nml N/A  Left Abd Poll Brev Nml Nml Nml Nml Nml Nml Nml Nml N/A  Left PronatorTeres Nml Nml Nml *1- *1+ *1+ *1+ Nml N/A  Left Biceps Nml Nml Nml *1- *1+ *1+ *1+ Nml N/A  Left Triceps Nml Nml Nml Nml Nml Nml Nml Nml N/A  Left Deltoid Nml Nml Nml *1- *1+ *1+ *1+ Nml N/A      Waveforms:

## 2023-11-10 ENCOUNTER — Encounter: Payer: Self-pay | Admitting: Family Medicine

## 2023-11-10 DIAGNOSIS — M50122 Cervical disc disorder at C5-C6 level with radiculopathy: Secondary | ICD-10-CM

## 2023-11-10 DIAGNOSIS — M5030 Other cervical disc degeneration, unspecified cervical region: Secondary | ICD-10-CM

## 2023-11-14 ENCOUNTER — Ambulatory Visit (INDEPENDENT_AMBULATORY_CARE_PROVIDER_SITE_OTHER): Payer: PPO

## 2023-11-14 DIAGNOSIS — M5030 Other cervical disc degeneration, unspecified cervical region: Secondary | ICD-10-CM | POA: Diagnosis not present

## 2023-11-14 DIAGNOSIS — M4803 Spinal stenosis, cervicothoracic region: Secondary | ICD-10-CM | POA: Diagnosis not present

## 2023-11-14 DIAGNOSIS — M4802 Spinal stenosis, cervical region: Secondary | ICD-10-CM | POA: Diagnosis not present

## 2023-11-14 DIAGNOSIS — M50122 Cervical disc disorder at C5-C6 level with radiculopathy: Secondary | ICD-10-CM

## 2023-11-14 DIAGNOSIS — M47811 Spondylosis without myelopathy or radiculopathy, occipito-atlanto-axial region: Secondary | ICD-10-CM | POA: Diagnosis not present

## 2023-11-18 ENCOUNTER — Other Ambulatory Visit: Payer: Self-pay | Admitting: Family Medicine

## 2023-11-19 ENCOUNTER — Encounter: Payer: Self-pay | Admitting: Family Medicine

## 2023-11-25 NOTE — Telephone Encounter (Signed)
[facility] Radiology.

## 2023-11-26 ENCOUNTER — Encounter: Payer: Self-pay | Admitting: Family Medicine

## 2023-11-26 ENCOUNTER — Other Ambulatory Visit: Payer: Self-pay | Admitting: Family Medicine

## 2023-11-26 DIAGNOSIS — M4802 Spinal stenosis, cervical region: Secondary | ICD-10-CM

## 2023-11-26 DIAGNOSIS — M4800 Spinal stenosis, site unspecified: Secondary | ICD-10-CM

## 2023-12-08 DIAGNOSIS — M4802 Spinal stenosis, cervical region: Secondary | ICD-10-CM | POA: Diagnosis not present

## 2023-12-08 DIAGNOSIS — M5412 Radiculopathy, cervical region: Secondary | ICD-10-CM | POA: Diagnosis not present

## 2023-12-08 DIAGNOSIS — Z6825 Body mass index (BMI) 25.0-25.9, adult: Secondary | ICD-10-CM | POA: Diagnosis not present

## 2023-12-13 ENCOUNTER — Telehealth: Payer: Self-pay | Admitting: *Deleted

## 2023-12-13 NOTE — Telephone Encounter (Signed)
   Pre-operative Risk Assessment    Patient Name: Jamie Rice  DOB: 04-21-1956 MRN: [id number]   Date of last office visit: 07/05/23 DR. VARANASI Date of next office visit: 12/31/23 DR. SKAINS-6 MONTH F/U   Request for Surgical Clearance    Procedure:   C3-C4 ESI LEFT   Date of Surgery:  Clearance TBD (PER FORM STAT)                               Surgeon:  DR. DAVE [facility] Surgeon's Group or Practice Name:  [facility] Phone number:  [phone]; Salome ArntMarena Chancy Fax number:  [phone]   Type of Clearance Requested:   - Medical  - Pharmacy:  Hold Clopidogrel (Plavix) x 7 DAYS PRIOR   Type of Anesthesia:  Not Indicated   Additional requests/questions:    Elpidio Anis   12/13/2023, 1:06 PM

## 2023-12-13 NOTE — Telephone Encounter (Signed)
   Name: Jamie Rice  DOB: [birth year]  MRN: [id number]  Primary Cardiologist: Lance Muss, MD  Chart reviewed as part of pre-operative protocol coverage. The patient has an upcoming visit scheduled with Dr. Anne Fu on 12/31/23 at which time clearance can be addressed in case there are any issues that would impact surgical recommendations.  Surgery is scheduled TBD. I added preop FYI to appointment note so that provider is aware to address at time of outpatient visit.  Per office protocol the cardiology provider should forward their finalized clearance decision and recommendations regarding antiplatelet therapy to the requesting party below.    This message will also be routed to Dr. Anne Fu for input on holding Plavix as requested below. Unable to advise on Plavix hold as 7 days is longer than protocol.   Procedure:   C3-C4 ESI LEFT    Date of Surgery:  Clearance TBD (PER FORM STAT)                                 Surgeon:  DR. DAVE [facility] Surgeon's Group or Practice Name:  [facility] Phone number:  [phone]; Salome ArntMarena Chancy Fax number:  [phone]   Type of Clearance Requested:   - Medical  - Pharmacy:  Hold Clopidogrel (Plavix) x 7 DAYS PRIOR  I will route this message as FYI to requesting party and remove this message from the preop box as separate preop APP input not needed at this time.   Please call with any questions.  Perlie Gold, PA-C  12/13/2023, 1:17 PM

## 2023-12-14 NOTE — Telephone Encounter (Signed)
   Patient Name: Jamie Rice  DOB: [birth year] MRN: [id number]  Primary Cardiologist: Donato Schultz, MD  Chart reviewed as part of pre-operative protocol coverage. Given past medical history and time since last visit, based on ACC/AHA guidelines, Jamie Rice is at acceptable risk for the planned procedure without further cardiovascular testing.   Per Dr. Anne Fu, "OK to hold Plavix for 7 days prior to procedure OK to proceed. Donato Schultz, MD"  Please resume Plavix as soon as possible postprocedure, at the discretion of the surgeon.   I will route this recommendation to the requesting party via Epic fax function and remove from pre-op pool.  Please call with questions.  Joylene Grapes, NP 12/14/2023, 8:49 AM

## 2023-12-23 DIAGNOSIS — M5412 Radiculopathy, cervical region: Secondary | ICD-10-CM | POA: Diagnosis not present

## 2023-12-31 ENCOUNTER — Other Ambulatory Visit: Payer: Self-pay | Admitting: Cardiology

## 2023-12-31 ENCOUNTER — Ambulatory Visit: Payer: PPO | Attending: Cardiology | Admitting: Cardiology

## 2024-01-03 ENCOUNTER — Encounter: Payer: Self-pay | Admitting: Cardiology

## 2024-01-10 ENCOUNTER — Other Ambulatory Visit: Payer: Self-pay | Admitting: Interventional Cardiology

## 2024-01-21 ENCOUNTER — Other Ambulatory Visit: Payer: Self-pay

## 2024-01-21 DIAGNOSIS — M5412 Radiculopathy, cervical region: Secondary | ICD-10-CM | POA: Diagnosis not present

## 2024-01-21 DIAGNOSIS — M4802 Spinal stenosis, cervical region: Secondary | ICD-10-CM | POA: Diagnosis not present

## 2024-01-21 DIAGNOSIS — Z6825 Body mass index (BMI) 25.0-25.9, adult: Secondary | ICD-10-CM | POA: Diagnosis not present

## 2024-01-21 MED ORDER — ENALAPRIL MALEATE 10 MG PO TABS: 10.0000 mg | ORAL_TABLET | Freq: Every day | ORAL | 1 refills | Status: DC

## 2024-01-21 NOTE — Telephone Encounter (Signed)
 Pt's medication was sent to pt's pharmacy as requested. Enough medication to get pt to his appt in June 2025 with Dr. Anne Fu. Confirmation received.
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
## 2024-02-01 ENCOUNTER — Encounter: Payer: Self-pay | Admitting: Rehabilitative and Restorative Service Providers"

## 2024-02-01 ENCOUNTER — Other Ambulatory Visit: Payer: Self-pay

## 2024-02-01 ENCOUNTER — Ambulatory Visit: Attending: Neurosurgery | Admitting: Rehabilitative and Restorative Service Providers"

## 2024-02-01 DIAGNOSIS — R200 Anesthesia of skin: Secondary | ICD-10-CM | POA: Insufficient documentation

## 2024-02-01 DIAGNOSIS — M6281 Muscle weakness (generalized): Secondary | ICD-10-CM | POA: Insufficient documentation

## 2024-02-01 DIAGNOSIS — R293 Abnormal posture: Secondary | ICD-10-CM | POA: Insufficient documentation

## 2024-02-01 DIAGNOSIS — M5412 Radiculopathy, cervical region: Secondary | ICD-10-CM | POA: Insufficient documentation

## 2024-02-02 ENCOUNTER — Ambulatory Visit: Admitting: Rehabilitative and Restorative Service Providers"

## 2024-02-08 ENCOUNTER — Encounter: Admitting: Rehabilitative and Restorative Service Providers"

## 2024-02-10 ENCOUNTER — Ambulatory Visit: Attending: Neurosurgery

## 2024-02-10 DIAGNOSIS — R293 Abnormal posture: Secondary | ICD-10-CM | POA: Diagnosis not present

## 2024-02-10 DIAGNOSIS — M6281 Muscle weakness (generalized): Secondary | ICD-10-CM | POA: Insufficient documentation

## 2024-02-10 DIAGNOSIS — R200 Anesthesia of skin: Secondary | ICD-10-CM | POA: Diagnosis not present

## 2024-02-10 DIAGNOSIS — M542 Cervicalgia: Secondary | ICD-10-CM | POA: Insufficient documentation

## 2024-02-10 DIAGNOSIS — M5412 Radiculopathy, cervical region: Secondary | ICD-10-CM | POA: Insufficient documentation
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
## 2024-02-15 ENCOUNTER — Ambulatory Visit: Admitting: Rehabilitative and Restorative Service Providers"

## 2024-02-17 ENCOUNTER — Ambulatory Visit

## 2024-02-17 DIAGNOSIS — M6281 Muscle weakness (generalized): Secondary | ICD-10-CM

## 2024-02-17 DIAGNOSIS — R293 Abnormal posture: Secondary | ICD-10-CM

## 2024-02-17 DIAGNOSIS — M542 Cervicalgia: Secondary | ICD-10-CM

## 2024-02-17 DIAGNOSIS — M5412 Radiculopathy, cervical region: Secondary | ICD-10-CM | POA: Diagnosis not present

## 2024-02-17 DIAGNOSIS — R200 Anesthesia of skin: Secondary | ICD-10-CM
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
## 2024-02-22 ENCOUNTER — Ambulatory Visit: Admitting: Physical Therapy
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
## 2024-02-24 ENCOUNTER — Ambulatory Visit: Admitting: Physical Therapy

## 2024-03-07 ENCOUNTER — Ambulatory Visit: Admitting: Rehabilitative and Restorative Service Providers"

## 2024-03-07 ENCOUNTER — Encounter: Payer: Self-pay | Admitting: Rehabilitative and Restorative Service Providers"

## 2024-03-07 DIAGNOSIS — M5412 Radiculopathy, cervical region: Secondary | ICD-10-CM | POA: Diagnosis not present

## 2024-03-07 DIAGNOSIS — R200 Anesthesia of skin: Secondary | ICD-10-CM

## 2024-03-07 DIAGNOSIS — M6281 Muscle weakness (generalized): Secondary | ICD-10-CM

## 2024-03-07 DIAGNOSIS — M542 Cervicalgia: Secondary | ICD-10-CM

## 2024-03-07 DIAGNOSIS — R293 Abnormal posture: Secondary | ICD-10-CM
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
## 2024-03-09 ENCOUNTER — Ambulatory Visit

## 2024-03-09 DIAGNOSIS — M5412 Radiculopathy, cervical region: Secondary | ICD-10-CM

## 2024-03-09 DIAGNOSIS — R200 Anesthesia of skin: Secondary | ICD-10-CM

## 2024-03-09 DIAGNOSIS — R293 Abnormal posture: Secondary | ICD-10-CM

## 2024-03-09 DIAGNOSIS — M6281 Muscle weakness (generalized): Secondary | ICD-10-CM

## 2024-03-09 DIAGNOSIS — M542 Cervicalgia: Secondary | ICD-10-CM
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
## 2024-03-16 ENCOUNTER — Encounter: Payer: Self-pay | Admitting: Cardiology

## 2024-03-16 ENCOUNTER — Ambulatory Visit: Attending: Cardiology | Admitting: Cardiology

## 2024-03-16 VITALS — BP 124/62 | HR 57 | Ht 71.0 in | Wt 180.0 lb

## 2024-03-16 DIAGNOSIS — I10 Essential (primary) hypertension: Secondary | ICD-10-CM

## 2024-03-16 DIAGNOSIS — I2510 Atherosclerotic heart disease of native coronary artery without angina pectoris: Secondary | ICD-10-CM

## 2024-03-16 DIAGNOSIS — I252 Old myocardial infarction: Secondary | ICD-10-CM

## 2024-03-16 MED ORDER — NITROGLYCERIN 0.4 MG SL SUBL: 0.4000 mg | SUBLINGUAL_TABLET | SUBLINGUAL | 4 refills | Status: AC | PRN

## 2024-03-16 MED ORDER — ENALAPRIL MALEATE 10 MG PO TABS
10.0000 mg | ORAL_TABLET | Freq: Every day | ORAL | 3 refills | Status: AC
  Filled 2024-09-27: qty 90, 90d supply, fill #0

## 2024-03-16 MED ORDER — ATORVASTATIN CALCIUM 80 MG PO TABS
80.0000 mg | ORAL_TABLET | Freq: Every day | ORAL | 3 refills | Status: AC
  Filled 2024-09-27: qty 90, 90d supply, fill #0

## 2024-03-16 MED ORDER — EZETIMIBE 10 MG PO TABS
10.0000 mg | ORAL_TABLET | Freq: Every day | ORAL | 3 refills | Status: AC
  Filled 2024-09-27: qty 90, 90d supply, fill #0

## 2024-03-16 NOTE — Progress Notes (Signed)
 Cardiology Office Note:  .   Date:  03/16/2024  ID:  Jamie Rice, DOB October 31, 1955, MRN [id number] PCP: Adela Holter, DO  [facility] Providers Cardiologist:  Dorothye Gathers, MD     History of Present Illness: .   Jamie Rice is a 68 y.o. male Discussed the use of AI scribe software for clinical note transcription with the patient, who gave verbal consent to proceed.  History of Present Illness Jamie Rice is a 68 year old male with coronary artery disease who presents with concerns about low heart rate and decreased energy levels.  In October 2014, he experienced an inferior myocardial infarction and received a 4.0 by 20 millimeter bare metal stent in the right coronary artery ([facility]). Since then, he has been on a stable medication regimen including atorvastatin  80 mg, Zetia  (ezetimibe ) 10 mg, clopidogrel  75 mg, and enalapril  10 mg. He also takes fish oil, vitamin D , vitamin K, and vitamin B12. He questions whether the dosages of his medications should be adjusted after 11 years.  Over the past year, he has noticed a decrease in his energy levels. He is very energetic in the mornings but experiences a significant drop in energy by noon, and by 3-4 PM, his energy is down to about 50%, which is a change from his previous levels of activity. His energy levels might be affected by his diabetes, with a recent A1c of 7.5.  He has been informed of a low heart rate, which was not previously mentioned to him. His heart rate was noted to be 57, and he is concerned about whether this is a new development. He recalls a previous pulse of 61 in 2021. No symptoms of shortness of breath or inability to increase heart rate with exertion.  He is scheduled for neck surgery due to tingling in his arm and lower left face, confirmed by MRI to be due to spinal issues. He plans to have the surgery in October to avoid downtime during the summer. He received temporary relief from a  cortisone-like shot but reports that the effect has worn off.  He has a history of diabetes and is a former smoker. His father had coronary artery bypass graft (CABG) 30 years ago.      ROS: No CP  Studies Reviewed: .        Results LABS HbA1c: 7.5% LDL: 59 mg/dL Hb: 17 g/dL  RADIOLOGY Abdominal aortic aneurysm ultrasound: Screening  DIAGNOSTIC Nuclear stress test: Normal, low risk with normal ejection fraction (07/2023) Risk Assessment/Calculations:            Physical Exam:   VS:  BP 124/62 (BP Location: Left Arm, Patient Position: Sitting, Cuff Size: Normal)   Pulse (!) 57   Ht 5\' 11"  (1.803 m)   Wt 180 lb (81.6 kg)   SpO2 94%   BMI 25.10 kg/m    Wt Readings from Last 3 Encounters:  03/16/24 180 lb (81.6 kg)  09/14/23 177 lb (80.3 kg)  07/26/23 177 lb (80.3 kg)    GEN: Well nourished, well developed in no acute distress NECK: No JVD; No carotid bruits CARDIAC: RRR, no murmurs, no rubs, no gallops RESPIRATORY:  Clear to auscultation without rales, wheezing or rhonchi  ABDOMEN: Soft, non-tender, non-distended EXTREMITIES:  No edema; No deformity   ASSESSMENT AND PLAN: .    Assessment and Plan Assessment & Plan Inferior Myocardial Infarction with Stent CAD Inferior myocardial infarction in October 2014 with a bare metal stent in  the right coronary artery. Current medications include atorvastatin  80 mg, ezetimibe  10 mg, clopidogrel  75 mg, and enalapril  10 mg. Blood pressure is well-controlled. Emphasized the importance of maintaining the current medication regimen to manage cardiovascular risk. - Continue atorvastatin  80 mg daily. - Continue ezetimibe  10 mg daily. - Continue clopidogrel  75 mg daily. - Continue enalapril  10 mg daily.  Bradycardia Bradycardia with a heart rate of 57 bpm. No symptoms such as fatigue or dyspnea during activities. Discussed using a pulse oximeter to monitor heart rate response to activity. No intervention needed if heart rate  increases appropriately with exertion. Explained that a lower heart rate is generally favorable as it reduces myocardial oxygen demand. - Use pulse oximeter to monitor heart rate during activity. - Contact provider if heart rate does not increase appropriately with exertion.  Type 2 Diabetes Mellitus Type 2 diabetes mellitus with current A1c of 7.5%. - Continue current diabetes management.  Planned Neck Surgery Planned neck surgery for cervical spine issues causing tingling in the arm and lower left face. Surgery is scheduled for October. Current symptoms do not affect cardiac health. Discussed potential cessation of Plavix  7 days prior to surgery. Confirmed that delaying surgery until fall will not impact cardiac health. - Proceed with planned neck surgery in October. - Coordinate with surgical team regarding cessation of clopidogrel  prior to surgery. OK to proceed with surgery.          Dispo: 1 yr  Signed, Dorothye Gathers, MD

## 2024-03-16 NOTE — Patient Instructions (Signed)
 Medication Instructions:  The current medical regimen is effective;  continue present plan and medications.  *If you need a refill on your cardiac medications before your next appointment, please call your pharmacy*  Follow-Up: At [facility], you and your health needs are our priority.  As part of our continuing mission to provide you with exceptional heart care, our providers are all part of one team.  This team includes your primary Cardiologist (physician) and Advanced Practice Providers or APPs (Physician Assistants and Nurse Practitioners) who all work together to provide you with the care you need, when you need it.  Your next appointment:   1 year(s)  Provider:   Dorothye Gathers, MD    We recommend signing up for the patient portal called "MyChart".  Sign up information is provided on this After Visit Summary.  MyChart is used to connect with patients for Virtual Visits (Telemedicine).  Patients are able to view lab/test results, encounter notes, upcoming appointments, etc.  Non-urgent messages can be sent to your provider as well.   To learn more about what you can do with MyChart, go to [email or web address].   Thank you for choosing [facility]!!

## 2024-04-03 ENCOUNTER — Other Ambulatory Visit: Payer: Self-pay | Admitting: Family Medicine

## 2024-04-08 ENCOUNTER — Other Ambulatory Visit: Payer: Self-pay | Admitting: Family Medicine

## 2024-04-10 NOTE — Telephone Encounter (Signed)
 Pls contact pt to schedule DM & HLD appt with Dr. Alvia. Sending 30 day med refill. Fasting labs with appt. Thx.

## 2024-04-10 NOTE — Telephone Encounter (Signed)
 Called pt to scheduled, LVM.

## 2024-05-08 ENCOUNTER — Other Ambulatory Visit: Payer: Self-pay | Admitting: Family Medicine

## 2024-05-08 NOTE — Telephone Encounter (Signed)
 Pls contact pt to schedule DM 54-month appt (past due). Sending 30 day med refill. Thanks

## 2024-06-05 ENCOUNTER — Other Ambulatory Visit: Payer: Self-pay | Admitting: Family Medicine

## 2024-06-06 ENCOUNTER — Ambulatory Visit (INDEPENDENT_AMBULATORY_CARE_PROVIDER_SITE_OTHER)

## 2024-06-06 VITALS — Ht 71.0 in | Wt 189.0 lb

## 2024-06-06 DIAGNOSIS — Z0000 Encounter for general adult medical examination without abnormal findings: Secondary | ICD-10-CM | POA: Diagnosis not present

## 2024-06-06 NOTE — Progress Notes (Signed)
 Subjective:   Jamie Rice is a 68 y.o. male who presents for Medicare Annual/Subsequent preventive examination.  Visit Complete: Virtual I connected with  Jamie Rice on 06/06/24 by a audio enabled telemedicine application and verified that I am speaking with the correct person using two identifiers.  Patient Location: Home  Provider Location: Office/Clinic  I discussed the limitations of evaluation and management by telemedicine. The patient expressed understanding and agreed to proceed.  Vital Signs: Because this visit was a virtual/telehealth visit, some criteria may be missing or patient reported. Any vitals not documented were not able to be obtained and vitals that have been documented are patient reported.  Patient Medicare AWV questionnaire was completed by the patient on 06/02/2024; I have confirmed that all information answered by patient is correct and no changes since this date.  Cardiac Risk Factors include: advanced age (>69men, >13 women);male gender;hypertension;dyslipidemia;diabetes mellitus;smoking/ tobacco exposure;family history of premature cardiovascular disease     Objective:    Today's Vitals   06/06/24 1415  Weight: 189 lb (85.7 kg)  Height: 5' 11 (1.803 m)   Body mass index is 26.36 kg/m.     06/06/2024    2:24 PM 02/01/2024    8:06 AM 05/31/2023    1:46 PM 12/01/2022   10:30 AM 05/29/2022    1:13 PM  Advanced Directives  Does Patient Have a Medical Advance Directive? Yes Yes Yes Yes Yes  Type of Estate agent of [place];Living will Healthcare Power of [place];Living will Living will  Living will  Does patient want to make changes to medical advance directive? No - Patient declined  No - Patient declined  No - Patient declined  Copy of Healthcare Power of Attorney in Chart?  No - copy requested       Current Medications (verified) Outpatient Encounter Medications as of 06/06/2024  Medication Sig   atorvastatin   (LIPITOR) 80 MG tablet Take 1 tablet (80 mg total) by mouth daily.   cholecalciferol (VITAMIN D3) 25 MCG (1000 UNIT) tablet Take 1,000 Units by mouth daily.   Cinnamon 500 MG capsule Take 500 mg by mouth 2 (two) times daily.   clopidogrel  (PLAVIX ) 75 MG tablet TAKE ONE TABLET BY MOUTH ONCE A DAY   Cyanocobalamin (B-12 PO) Take 1,000 mcg by mouth.   enalapril  (VASOTEC ) 10 MG tablet Take 1 tablet (10 mg total) by mouth daily.   ezetimibe  (ZETIA ) 10 MG tablet Take 1 tablet (10 mg total) by mouth daily.   GINSENG PO Take 2,250 mg by mouth.   Lutein 20 MG TABS    Magnesium (RA NATURAL MAGNESIUM) 250 MG TABS    nitroGLYCERIN  (NITROSTAT ) 0.4 MG SL tablet Place 1 tablet (0.4 mg total) under the tongue every 5 (five) minutes as needed for chest pain.   Omega-3 Fatty Acids (FISH OIL PO) Take 1,200 mg by mouth 2 (two) times daily.    OVER THE COUNTER MEDICATION Take 1,000 mg by mouth. HMB supplement   pyridoxine (B-6) 250 MG tablet    vitamin k 100 MCG tablet Take 100 mcg by mouth daily.   XIGDUO  XR 07-999 MG TB24 TAKE ONE TABLET BY MOUTH ONCE A DAY   GARLIC PO Take 1,000 mg by mouth. (Patient not taking: Reported on 06/06/2024)   No facility-administered encounter medications on file as of 06/06/2024.    Allergies (verified) Patient has no known allergies.   History: Past Medical History:  Diagnosis Date   Anxiety    Arthritis  Back pain    CAD (coronary artery disease)    Depression    HTN (hypertension)    Hypercholesteremia    Past Surgical History:  Procedure Laterality Date   HERNIA REPAIR     PTCA with stent - [place]     VASECTOMY     Family History  Problem Relation Age of Onset   Heart disease Father    Heart attack Father    Hypertension Father    Stroke Father        X4   Hypertension Paternal Uncle        X4   Stroke Paternal Uncle    Social History   Socioeconomic History   Marital status: Widowed    Spouse name: Not on file   Number of children: 2    Years of education: 14   Highest education level: Some college, no degree  Occupational History   Occupation: Delivery    Comment: Part-time   Occupation: Retired  Tobacco Use   Smoking status: Former    Types: Cigarettes   Smokeless tobacco: Never  Vaping Use   Vaping status: Never Used  Substance and Sexual Activity   Alcohol use: Yes    Comment: rarely   Drug use: No   Sexual activity: Not on file  Other Topics Concern   Not on file  Social History Narrative   Lives alone. He has two children. He enjoys cooking and bowling.   Social Drivers of Health   Financial Resource Strain: Medium Risk (06/06/2024)   Overall Financial Resource Strain (CARDIA)    Difficulty of Paying Living Expenses: Somewhat hard  Food Insecurity: No Food Insecurity (06/06/2024)   Hunger Vital Sign    Worried About Running Out of Food in the Last Year: Never true    Ran Out of Food in the Last Year: Never true  Recent Concern: Food Insecurity - Food Insecurity Present (06/05/2024)   Hunger Vital Sign    Worried About Running Out of Food in the Last Year: Never true    Ran Out of Food in the Last Year: Sometimes true  Transportation Needs: No Transportation Needs (06/06/2024)   PRAPARE - Administrator, Civil Service (Medical): No    Lack of Transportation (Non-Medical): No  Physical Activity: Sufficiently Active (06/06/2024)   Exercise Vital Sign    Days of Exercise per Week: 7 days    Minutes of Exercise per Session: 50 min  Stress: No Stress Concern Present (06/06/2024)   [employer] of Occupational Health - Occupational Stress Questionnaire    Feeling of Stress: Not at all  Social Connections: Socially Isolated (06/06/2024)   Social Connection and Isolation Panel    Frequency of Communication with Friends and Family: Twice a week    Frequency of Social Gatherings with Friends and Family: More than three times a week    Attends Religious Services: Never    Database administrator  or Organizations: No    Attends Banker Meetings: Never    Marital Status: Widowed    Tobacco Counseling Counseling given: Not Answered   Clinical Intake:  Pre-visit preparation completed: Yes  Pain : No/denies pain     BMI - recorded: 26.36 Nutritional Status: BMI 25 -29 Overweight Nutritional Risks: None Diabetes: Yes CBG done?: No Did pt. Rice in CBG monitor from home?: No  How often Jamie Rice you need to have someone help you when you read instructions, pamphlets, or other written materials from  your doctor or pharmacy?: 1 - Never What is the last grade level you completed in school?: 14  Interpreter Needed?: No      Activities of Daily Living    06/06/2024    2:16 PM 06/02/2024    8:45 AM  In your present state of health, Jamie Rice you have any difficulty performing the following activities:  Hearing? 0 0  Vision? 0 0  Difficulty concentrating or making decisions? 0 0  Walking or climbing stairs? 0 0  Dressing or bathing? 0 0  Doing errands, shopping? 0 0  Preparing Food and eating ? N N  Using the Toilet? N N  In the past six months, have you accidently leaked urine? N N  Jamie Rice you have problems with loss of bowel control? N N  Managing your Medications? N N  Managing your Finances? N N  Housekeeping or managing your Housekeeping? N N    Patient Care Team: Jamie Bring, Jamie Rice as PCP - General (Family Medicine) Jamie Oneil BROCKS, Jamie Rice as PCP - Cardiology (Cardiology)  Indicate any recent Medical Services you may have received from other than [facility] providers in the past year (date may be approximate).     Assessment:   This is a routine wellness examination for Jamie Rice.  Hearing/Vision screen No results found.   Goals Addressed             This Visit's Progress    Patient Stated       Patient states he would to continue with his current lifestyle.        Depression Screen    06/06/2024    2:23 PM 05/31/2023    1:49 PM 04/01/2023    8:42 AM  11/18/2022    1:46 PM 05/29/2022    1:13 PM 05/18/2022    2:29 PM 05/15/2021    4:26 PM  PHQ 2/9 Scores  PHQ - 2 Score 0 0 2 4 0 4 2  PHQ- 9 Score   5 9 0 7 6    Fall Risk    06/06/2024    2:24 PM 06/02/2024    8:45 AM 05/31/2023    1:49 PM 05/27/2023    8:37 AM 04/01/2023    8:18 AM  Fall Risk   Falls in the past year? 1 1 1 1 1   Number falls in past yr: 0 0 0 0 0  Injury with Fall? 1 0 0 0 0  Risk for fall due to : History of fall(s)  History of fall(s)  No Fall Risks  Follow up Falls evaluation completed  Falls evaluation completed;Education provided  Falls evaluation completed    MEDICARE RISK AT HOME: Medicare Risk at Home Any stairs in or around the home?: Yes If so, are there any without handrails?: Yes Home free of loose throw rugs in walkways, pet beds, electrical cords, etc?: Yes Adequate lighting in your home to reduce risk of falls?: Yes Life alert?: No Use of a cane, walker or w/c?: No Grab bars in the bathroom?: No Shower chair or bench in shower?: No Elevated toilet seat or a handicapped toilet?: Yes  TIMED UP AND GO:  Was the test performed?  No    Cognitive Function:        06/06/2024    2:25 PM 05/31/2023    1:50 PM 05/29/2022    1:21 PM  6CIT Screen  What Year? 0 points 0 points 0 points  What month? 0 points 0 points 0 points  What time? 0 points 0 points 0 points  Count back from 20 0 points 0 points 0 points  Months in reverse 0 points 0 points 0 points  Repeat phrase 0 points 0 points 0 points  Total Score 0 points 0 points 0 points    Immunizations Immunization History  Administered Date(s) Administered   Fluad Quad(high Dose 65+) 07/29/2021, 06/09/2023   Influenza,inj,Quad PF,6+ Mos 08/02/2017   PFIZER(Purple Top)SARS-COV-2 Vaccination 12/20/2019, 01/10/2020, 07/27/2020   Pfizer Covid-19 Vaccine Bivalent Booster 21yrs & up 07/29/2021   Pfizer(Comirnaty)Fall Seasonal Vaccine 12 years and older 09/14/2023   Tdap 05/28/2018   Zoster  Recombinant(Shingrix ) 03/19/2022, 05/11/2022    TDAP status: Up to date  Flu Vaccine status: Due, Education has been provided regarding the importance of this vaccine. Advised may receive this vaccine at local pharmacy or Health Dept. Aware to provide a copy of the vaccination record if obtained from local pharmacy or Health Dept. Verbalized acceptance and understanding.  Pneumococcal vaccine status: Due, Education has been provided regarding the importance of this vaccine. Advised may receive this vaccine at local pharmacy or Health Dept. Aware to provide a copy of the vaccination record if obtained from local pharmacy or Health Dept. Verbalized acceptance and understanding.  Covid-19 vaccine status: Information provided on how to obtain vaccines.   Qualifies for Shingles Vaccine? Yes   Zostavax completed No   Shingrix  Completed?: Yes  Screening Tests Health Maintenance  Topic Date Due   Hepatitis C Screening  Never done   Pneumococcal Vaccine: 50+ Years (1 of 2 - PCV) Never done   OPHTHALMOLOGY EXAM  08/14/2023   HEMOGLOBIN A1C  09/01/2023   COVID-19 Vaccine (6 - 2024-25 season) 03/14/2024   Diabetic kidney evaluation - eGFR measurement  03/31/2024   FOOT EXAM  03/31/2024   INFLUENZA VACCINE  05/12/2024   Diabetic kidney evaluation - Urine ACR  06/08/2024   Medicare Annual Wellness (AWV)  06/06/2025   DTaP/Tdap/Td (2 - Td or Tdap) 05/28/2028   Colonoscopy  11/11/2028   Zoster Vaccines- Shingrix   Completed   HPV VACCINES  Aged Out   Meningococcal B Vaccine  Aged Out    Health Maintenance  Health Maintenance Due  Topic Date Due   Hepatitis C Screening  Never done   Pneumococcal Vaccine: 50+ Years (1 of 2 - PCV) Never done   OPHTHALMOLOGY EXAM  08/14/2023   HEMOGLOBIN A1C  09/01/2023   COVID-19 Vaccine (6 - 2024-25 season) 03/14/2024   Diabetic kidney evaluation - eGFR measurement  03/31/2024   FOOT EXAM  03/31/2024   INFLUENZA VACCINE  05/12/2024   Diabetic kidney  evaluation - Urine ACR  06/08/2024    Colorectal cancer screening: Type of screening: Colonoscopy. Completed 11/11/2018. Repeat every 10 years  Lung Cancer Screening: (Low Dose CT Chest recommended if Age 86-80 years, 20 pack-year currently smoking OR have quit w/in 15years.) does not qualify.   Lung Cancer Screening Referral: n/a  Additional Screening:  Hepatitis C Screening: does qualify; Completed not yet  Vision Screening: Recommended annual ophthalmology exams for early detection of glaucoma and other disorders of the eye. Is the patient up to date with their annual eye exam?  Yes  Who is the provider or what is the name of the office in which the patient attends annual eye exams? [facility] If pt is not established with a provider, would they like to be referred to a provider to establish care? N/a.   Dental Screening: Recommended annual dental exams for proper  oral hygiene  Diabetic Foot Exam: Diabetic Foot Exam: Overdue, Pt has been advised about the importance in completing this exam. Pt is scheduled for diabetic foot exam on  .  Community Resource Referral / Chronic Care Management: CRR required this visit?  No   CCM required this visit?  Appt scheduled with PCP     Plan:     I have personally reviewed and noted the following in the patient's chart:   Medical and social history Use of alcohol, tobacco or illicit drugs  Current medications and supplements including opioid prescriptions. Patient is not currently taking opioid prescriptions. Functional ability and status Nutritional status Physical activity Advanced directives List of other physicians Hospitalizations, surgeries, and ER visits in previous 12 months. None Vitals Screenings to include cognitive, depression, and falls Referrals and appointments  In addition, I have reviewed and discussed with patient certain preventive protocols, quality metrics, and best practice recommendations. A written  personalized care plan for preventive services as well as general preventive health recommendations were provided to patient.     Jamie Rice, Jamie Rice   06/06/2024   After Visit Summary: (MyChart) Due to this being a telephonic visit, the after visit summary with patients personalized plan was offered to patient via MyChart   Nurse Notes:   Jamie Rice is a 68 y.o. male patient of Jamie Bring, Jamie Rice who had a Medicare Annual Wellness Visit today via telephone. Jamie Rice is Retired and lives alone. He has 2 children. He reports that he is socially active and does interact with friends/family regularly. He is moderately physically active and enjoys cooking and bowling.

## 2024-06-06 NOTE — Patient Instructions (Signed)
  Jamie Rice , Thank you for taking time to come for your Medicare Wellness Visit. I appreciate your ongoing commitment to your health goals. Please review the following plan we discussed and let me know if I can assist you in the future.   These are the goals we discussed:  Goals       Patient Stated (pt-stated)      05/29/2022 AWV Goal: Diabetes Management  Patient will maintain an A1C level below 6.0 Patient will not develop any diabetic foot complications Patient will not experience any hypoglycemic episodes over the next 3 months Patient will notify our office of any CBG readings outside of the provider recommended range by calling [phone] Patient will adhere to provider recommendations for diabetes management  Patient Self Management Activities take all medications as prescribed and report any negative side effects monitor and record blood sugar readings as directed adhere to a low carbohydrate diet that incorporates lean proteins, vegetables, whole grains, low glycemic fruits check feet daily noting any sores, cracks, injuries, or callous formations see PCP or podiatrist if he notices any changes in his legs, feet, or toenails Patient will visit PCP and have an A1C level checked every 3 to 6 months as directed  have a yearly eye exam to monitor for vascular changes associated with diabetes and will request that the report be sent to his pcp.  consult with his PCP regarding any changes in his health or new or worsening symptoms       Patient Stated (pt-stated)      Patient stated that he would like to get off all of his medications      Patient Stated      Patient states he would to continue with his current lifestyle.         This is a list of the screening recommended for you and due dates:  Health Maintenance  Topic Date Due   Hepatitis C Screening  Never done   Pneumococcal Vaccine for age over 73 (1 of 2 - PCV) Never done   Eye exam for diabetics  08/14/2023    Hemoglobin A1C  09/01/2023   COVID-19 Vaccine (6 - 2024-25 season) 03/14/2024   Yearly kidney function blood test for diabetes  03/31/2024   Complete foot exam   03/31/2024   Flu Shot  05/12/2024   Yearly kidney health urinalysis for diabetes  06/08/2024   Medicare Annual Wellness Visit  06/06/2025   DTaP/Tdap/Td vaccine (2 - Td or Tdap) 05/28/2028   Colon Cancer Screening  11/11/2028   Zoster (Shingles) Vaccine  Completed   HPV Vaccine  Aged Out   Meningitis B Vaccine  Aged Out

## 2024-06-13 ENCOUNTER — Encounter: Payer: Self-pay | Admitting: Sports Medicine

## 2024-06-23 ENCOUNTER — Telehealth: Payer: Self-pay

## 2024-06-23 NOTE — Progress Notes (Signed)
   06/23/2024  Patient ID: Debby LELON Charnley, male   DOB: 29-Feb-1956, 68 y.o.   MRN: [id number]  This patient is appearing on a report for being at risk of failing the adherence measure for cholesterol (statin) medications this calendar year.   Medication: atorvastatin  80mg  Last fill date: 02/17/24 for 90 day supply  MyChart message sent to patient.  Channing DELENA Mealing, PharmD, DPLA

## 2024-06-27 ENCOUNTER — Other Ambulatory Visit: Payer: Self-pay | Admitting: Family Medicine

## 2024-07-02 ENCOUNTER — Other Ambulatory Visit: Payer: Self-pay | Admitting: Family Medicine

## 2024-07-04 ENCOUNTER — Other Ambulatory Visit: Payer: Self-pay | Admitting: Family Medicine

## 2024-07-05 ENCOUNTER — Ambulatory Visit (INDEPENDENT_AMBULATORY_CARE_PROVIDER_SITE_OTHER): Admitting: Family Medicine

## 2024-07-05 VITALS — BP 111/69 | HR 64 | Ht 71.0 in | Wt 177.0 lb

## 2024-07-05 DIAGNOSIS — I10 Essential (primary) hypertension: Secondary | ICD-10-CM | POA: Diagnosis not present

## 2024-07-05 DIAGNOSIS — Z0000 Encounter for general adult medical examination without abnormal findings: Secondary | ICD-10-CM

## 2024-07-05 DIAGNOSIS — Z125 Encounter for screening for malignant neoplasm of prostate: Secondary | ICD-10-CM

## 2024-07-05 DIAGNOSIS — Z7984 Long term (current) use of oral hypoglycemic drugs: Secondary | ICD-10-CM

## 2024-07-05 DIAGNOSIS — E785 Hyperlipidemia, unspecified: Secondary | ICD-10-CM

## 2024-07-05 DIAGNOSIS — E1169 Type 2 diabetes mellitus with other specified complication: Secondary | ICD-10-CM | POA: Diagnosis not present

## 2024-07-05 DIAGNOSIS — I252 Old myocardial infarction: Secondary | ICD-10-CM | POA: Diagnosis not present

## 2024-07-05 DIAGNOSIS — Z23 Encounter for immunization: Secondary | ICD-10-CM | POA: Diagnosis not present

## 2024-07-05 LAB — POCT GLYCOSYLATED HEMOGLOBIN (HGB A1C): HbA1c, POC (controlled diabetic range): 7.2 % — AB (ref 0.0–7.0)

## 2024-07-05 NOTE — Progress Notes (Signed)
 Jamie Rice - 68 y.o. male MRN [id number]  Date of birth: 01-24-1956  Subjective Chief Complaint  Patient presents with   Diabetes   Hypertension    HPI Jamie Rice is a 68 y.o. male here today for follow-up visit.  He reports he is doing well at this time.  He is interested in discontinuing Plavix  due to recurrent issues with bruising.  He does have history of bare-metal stent several years ago.  He is followed by cardiology and was last seen about 3 months ago.  Remains on enalapril  for management of hypertension.  Blood pressure is well-controlled at this time.  Denies chest pain, shortness of breath, palpitations, headaches or vision changes.  Diabetes has remained well-controlled with Xigduo .  A1c today is 7.2%.  Remains on atorvastatin  and Zetia  for management of hyperlipidemia.  ROS:  A comprehensive ROS was completed and negative except as noted per HPI  Past Medical History:  Diagnosis Date   Anxiety    Arthritis    Back pain    CAD (coronary artery disease)    Depression    HTN (hypertension)    Hypercholesteremia     Past Surgical History:  Procedure Laterality Date   HERNIA REPAIR     PTCA with stent - [place]     VASECTOMY      Social History   Socioeconomic History   Marital status: Widowed    Spouse name: Not on file   Number of children: 2   Years of education: 14   Highest education level: Some college, no degree  Occupational History   Occupation: Delivery    Comment: Part-time   Occupation: Retired  Tobacco Use   Smoking status: Former    Types: Cigarettes   Smokeless tobacco: Never  Vaping Use   Vaping status: Never Used  Substance and Sexual Activity   Alcohol use: Yes    Comment: rarely   Drug use: No   Sexual activity: Not on file  Other Topics Concern   Not on file  Social History Narrative   Lives alone. He has two children. He enjoys cooking and bowling.   Social Drivers of Health   Financial Resource  Strain: Medium Risk (06/06/2024)   Overall Financial Resource Strain (CARDIA)    Difficulty of Paying Living Expenses: Somewhat hard  Food Insecurity: No Food Insecurity (06/06/2024)   Hunger Vital Sign    Worried About Running Out of Food in the Last Year: Never true    Ran Out of Food in the Last Year: Never true  Recent Concern: Food Insecurity - Food Insecurity Present (06/05/2024)   Hunger Vital Sign    Worried About Running Out of Food in the Last Year: Never true    Ran Out of Food in the Last Year: Sometimes true  Transportation Needs: No Transportation Needs (06/06/2024)   PRAPARE - Administrator, Civil Service (Medical): No    Lack of Transportation (Non-Medical): No  Physical Activity: Sufficiently Active (06/06/2024)   Exercise Vital Sign    Days of Exercise per Week: 7 days    Minutes of Exercise per Session: 50 min  Stress: No Stress Concern Present (06/06/2024)   [employer] of Occupational Health - Occupational Stress Questionnaire    Feeling of Stress: Not at all  Social Connections: Socially Isolated (06/06/2024)   Social Connection and Isolation Panel    Frequency of Communication with Friends and Family: Twice a week    Frequency of Social  Gatherings with Friends and Family: More than three times a week    Attends Religious Services: Never    Database administrator or Organizations: No    Attends Banker Meetings: Never    Marital Status: Widowed    Family History  Problem Relation Age of Onset   Heart disease Father    Heart attack Father    Hypertension Father    Stroke Father        X4   Hypertension Paternal Uncle        X4   Stroke Paternal Uncle     Health Maintenance  Topic Date Due   Hepatitis C Screening  Never done   OPHTHALMOLOGY EXAM  08/14/2023   COVID-19 Vaccine (6 - 2024-25 season) 07/21/2024 (Originally 06/12/2024)   HEMOGLOBIN A1C  01/02/2025   Medicare Annual Wellness (AWV)  06/06/2025   Diabetic kidney  evaluation - eGFR measurement  07/05/2025   FOOT EXAM  07/05/2025   Diabetic kidney evaluation - Urine ACR  07/06/2025   DTaP/Tdap/Td (2 - Td or Tdap) 05/28/2028   Colonoscopy  11/11/2028   Pneumococcal Vaccine: 50+ Years  Completed   Influenza Vaccine  Completed   Zoster Vaccines- Shingrix   Completed   HPV VACCINES  Aged Out   Meningococcal B Vaccine  Aged Out     ----------------------------------------------------------------------------------------------------------------------------------------------------------------------------------------------------------------- Physical Exam BP 111/69 (BP Location: Left Arm, Patient Position: Sitting, Cuff Size: Normal)   Pulse 64   Ht 5' 11 (1.803 m)   Wt 177 lb (80.3 kg)   SpO2 95%   BMI 24.69 kg/m   Physical Exam Constitutional:      Appearance: Normal appearance.  Eyes:     General: No scleral icterus. Cardiovascular:     Rate and Rhythm: Normal rate and regular rhythm.  Pulmonary:     Effort: Pulmonary effort is normal.     Breath sounds: Normal breath sounds.  Neurological:     General: No focal deficit present.     Mental Status: He is alert.  Psychiatric:        Mood and Affect: Mood normal.        Behavior: Behavior normal.     ------------------------------------------------------------------------------------------------------------------------------------------------------------------------------------------------------------------- Assessment and Plan  HTN (hypertension) Blood pressures remain well-controlled with current medications.  No side effects at this time.  Type 2 diabetes mellitus (HCC) Diabetes remains fairly well-controlled.  Recommend continuation of Xigduo  XR.  History of myocardial infarction History of bare-metal stent placed previously.  He does remain on Plavix .  May be able to transition to aspirin alone.  Will try to reach out to his cardiologist to see if he can make this  transition.  Hyperlipidemia associated with type 2 diabetes mellitus (HCC) Tolerating combination of atorvastatin  and Zetia  well.  Encouraged continued dietary changes to help with management of glucose and cholesterol.   No orders of the defined types were placed in this encounter.   Return in about 6 months (around 01/02/2025) for Hypertension, Type 2 Diabetes.

## 2024-07-06 DIAGNOSIS — E1169 Type 2 diabetes mellitus with other specified complication: Secondary | ICD-10-CM | POA: Diagnosis not present

## 2024-07-06 LAB — CMP14+EGFR
ALT: 18 IU/L (ref 0–44)
AST: 21 IU/L (ref 0–40)
Albumin: 4.9 g/dL (ref 3.9–4.9)
Alkaline Phosphatase: 87 IU/L (ref 47–123)
BUN/Creatinine Ratio: 19 (ref 10–24)
BUN: 21 mg/dL (ref 8–27)
Bilirubin Total: 0.6 mg/dL (ref 0.0–1.2)
CO2: 19 mmol/L — ABNORMAL LOW (ref 20–29)
Calcium: 10 mg/dL (ref 8.6–10.2)
Chloride: 100 mmol/L (ref 96–106)
Creatinine, Ser: 1.08 mg/dL (ref 0.76–1.27)
Globulin, Total: 1.8 g/dL (ref 1.5–4.5)
Glucose: 107 mg/dL — ABNORMAL HIGH (ref 70–99)
Potassium: 4.7 mmol/L (ref 3.5–5.2)
Sodium: 138 mmol/L (ref 134–144)
Total Protein: 6.7 g/dL (ref 6.0–8.5)
eGFR: 75 mL/min/1.73 (ref >59)

## 2024-07-06 LAB — PSA: Prostate Specific Ag, Serum: 1.7 ng/mL (ref 0.0–4.0)

## 2024-07-06 LAB — CBC
Hematocrit: 49.9 % (ref 37.5–51.0)
Hemoglobin: 16.4 g/dL (ref 13.0–17.7)
MCH: 32.1 pg (ref 26.6–33.0)
MCHC: 32.9 g/dL (ref 31.5–35.7)
MCV: 98 fL — ABNORMAL HIGH (ref 79–97)
Platelets: 335 x10E3/uL (ref 150–450)
RBC: 5.11 x10E6/uL (ref 4.14–5.80)
RDW: 11.9 % (ref 11.6–15.4)
WBC: 14.4 x10E3/uL — ABNORMAL HIGH (ref 3.4–10.8)

## 2024-07-07 LAB — SPECIMEN STATUS REPORT

## 2024-07-07 LAB — MICROALBUMIN / CREATININE URINE RATIO
Creatinine, Urine: 92 mg/dL
Microalb/Creat Ratio: <3 mg/g{creat} (ref 0–29)
Microalbumin, Urine: <3 ug/mL

## 2024-07-09 ENCOUNTER — Encounter: Payer: Self-pay | Admitting: Family Medicine

## 2024-07-09 NOTE — Assessment & Plan Note (Signed)
 History of bare-metal stent placed previously.  He does remain on Plavix .  May be able to transition to aspirin alone.  Will try to reach out to his cardiologist to see if he can make this transition.

## 2024-07-09 NOTE — Assessment & Plan Note (Signed)
Blood pressures remain well-controlled with current medications.  No side effects at this time.

## 2024-07-09 NOTE — Assessment & Plan Note (Signed)
 Diabetes remains fairly well-controlled.  Recommend continuation of Xigduo  XR.

## 2024-07-09 NOTE — Assessment & Plan Note (Signed)
 Tolerating combination of atorvastatin  and Zetia  well.  Encouraged continued dietary changes to help with management of glucose and cholesterol.

## 2024-07-13 NOTE — Progress Notes (Signed)
   07/13/2024  Patient ID: Jamie Rice, male   DOB: [birth year], 68 y.o.   MRN: [id number]  This patient is appearing on a report for being at risk of failing the adherence measure for diabetes medications this calendar year.   Medication: Xigduo  XR 10 mg -1000 mg tablets  Last fill date: 07/04/24 for 90 day supply  Insurance report was not up to date. No action needed at this time.   Jusitn Salsgiver C. Glada Wickstrom [facility] PharmD Candidate Class of [phone]

## 2024-07-17 ENCOUNTER — Ambulatory Visit: Payer: Self-pay | Admitting: Family Medicine

## 2024-07-17 DIAGNOSIS — D72829 Elevated white blood cell count, unspecified: Secondary | ICD-10-CM

## 2024-07-21 ENCOUNTER — Encounter: Payer: Self-pay | Admitting: Family Medicine

## 2024-07-21 DIAGNOSIS — Z6826 Body mass index (BMI) 26.0-26.9, adult: Secondary | ICD-10-CM | POA: Diagnosis not present

## 2024-07-21 DIAGNOSIS — M4712 Other spondylosis with myelopathy, cervical region: Secondary | ICD-10-CM | POA: Diagnosis not present

## 2024-07-24 ENCOUNTER — Telehealth (HOSPITAL_BASED_OUTPATIENT_CLINIC_OR_DEPARTMENT_OTHER): Payer: Self-pay | Admitting: *Deleted

## 2024-07-24 ENCOUNTER — Telehealth: Payer: Self-pay

## 2024-07-24 NOTE — Telephone Encounter (Signed)
  Patient Consent for Virtual Visit        Jamie Rice has provided verbal consent on 07/24/2024 for a virtual visit (video or telephone).   CONSENT FOR VIRTUAL VISIT FOR:  Jamie Rice  By participating in this virtual visit I agree to the following:  I hereby voluntarily request, consent and authorize [facility] and its employed or contracted physicians, physician assistants, nurse practitioners or other licensed health care professionals (the Practitioner), to provide me with telemedicine health care services (the "Services) as deemed necessary by the treating Practitioner. I acknowledge and consent to receive the Services by the Practitioner via telemedicine. I understand that the telemedicine visit will involve communicating with the Practitioner through live audiovisual communication technology and the disclosure of certain medical information by electronic transmission. I acknowledge that I have been given the opportunity to request an in-person assessment or other available alternative prior to the telemedicine visit and am voluntarily participating in the telemedicine visit.  I understand that I have the right to withhold or withdraw my consent to the use of telemedicine in the course of my care at any time, without affecting my right to future care or treatment, and that the Practitioner or I may terminate the telemedicine visit at any time. I understand that I have the right to inspect all information obtained and/or recorded in the course of the telemedicine visit and may receive copies of available information for a reasonable fee.  I understand that some of the potential risks of receiving the Services via telemedicine include:  Delay or interruption in medical evaluation due to technological equipment failure or disruption; Information transmitted may not be sufficient (e.g. poor resolution of images) to allow for appropriate medical decision making by the  Practitioner; and/or  In rare instances, security protocols could fail, causing a breach of personal health information.  Furthermore, I acknowledge that it is my responsibility to provide information about my medical history, conditions and care that is complete and accurate to the best of my ability. I acknowledge that Practitioner's advice, recommendations, and/or decision may be based on factors not within their control, such as incomplete or inaccurate data provided by me or distortions of diagnostic images or specimens that may result from electronic transmissions. I understand that the practice of medicine is not an exact science and that Practitioner makes no warranties or guarantees regarding treatment outcomes. I acknowledge that a copy of this consent can be made available to me via my patient portal [facility] MyChart), or I can request a printed copy by calling the office of [facility].    I understand that my insurance will be billed for this visit.   I have read or had this consent read to me. I understand the contents of this consent, which adequately explains the benefits and risks of the Services being provided via telemedicine.  I have been provided ample opportunity to ask questions regarding this consent and the Services and have had my questions answered to my satisfaction. I give my informed consent for the services to be provided through the use of telemedicine in my medical care

## 2024-07-24 NOTE — Telephone Encounter (Signed)
   Name: Jamie Rice  DOB: [birth year]  MRN: [id number]  Primary Cardiologist: Oneil Parchment, MD  Chart reviewed as part of pre-operative protocol coverage. Because of Revanth Neidig Santino's past medical history and time since last visit, he will require a follow-up telephone visit in order to better assess preoperative cardiovascular risk.  Pre-op covering staff: - Please schedule appointment and call patient to inform them. If patient already had an upcoming appointment within acceptable timeframe, please add pre-op clearance to the appointment notes so provider is aware. - Please contact requesting surgeon's office via preferred method (i.e, phone, fax) to inform them of need for appointment prior to surgery.  As long as patient is not having any new cardiac symptoms at the time of phone call, can hold Plavix  x 7 days prior to procedure and resume when medically safe to do so.  This patient does live in [place] .  Orren LOISE Fabry, PA-C  07/24/2024, 1:28 PM

## 2024-07-24 NOTE — Telephone Encounter (Signed)
   Pre-operative Risk Assessment    Patient Name: Jamie Rice  DOB: 1956/01/27 MRN: [id number]   Date of last office visit: 03/16/24 DR. SKAINS Date of next office visit: NONE   Request for Surgical Clearance    Procedure:  ACDF C3-C4  Date of Surgery:  Clearance TBD                                Surgeon:  DR. DORN DEBBY Surgeon's Group or Practice Name:  [facility] Phone number:  [phone] Fax number:  [phone] KATIE   Type of Clearance Requested:   - Medical  - Pharmacy:  Hold Clopidogrel  (Plavix )     Type of Anesthesia:  Not Indicated (GENERAL?)   Additional requests/questions:    [name]   07/24/2024, 11:25 AM

## 2024-07-24 NOTE — Telephone Encounter (Signed)
 Preop tele appt now scheduled, med rec and consent done.

## 2024-07-27 ENCOUNTER — Other Ambulatory Visit (HOSPITAL_COMMUNITY): Payer: Self-pay

## 2024-07-27 ENCOUNTER — Other Ambulatory Visit: Payer: Self-pay

## 2024-07-27 NOTE — Progress Notes (Signed)
 Contacted patient to discuss potential medication access barriers related to use of non-preferred pharmacy. Discussed health plan preferred pharmacies.  Patient requested support in collaborating with [employer] at [employer].   Jenkins Graces, PharmD PGY1 Pharmacy Resident [phone]

## 2024-07-28 ENCOUNTER — Other Ambulatory Visit (HOSPITAL_COMMUNITY): Payer: Self-pay

## 2024-07-31 DIAGNOSIS — D72829 Elevated white blood cell count, unspecified: Secondary | ICD-10-CM | POA: Diagnosis not present

## 2024-08-01 ENCOUNTER — Other Ambulatory Visit (HOSPITAL_COMMUNITY): Payer: Self-pay

## 2024-08-01 LAB — CBC WITH DIFFERENTIAL/PLATELET
Basophils Absolute: 0.1 x10E3/uL (ref 0.0–0.2)
Basos: 1 %
EOS (ABSOLUTE): 0.4 x10E3/uL (ref 0.0–0.4)
Eos: 3 %
Hematocrit: 49.2 % (ref 37.5–51.0)
Hemoglobin: 16.5 g/dL (ref 13.0–17.7)
Immature Grans (Abs): 0 x10E3/uL (ref 0.0–0.1)
Immature Granulocytes: 0 %
Lymphocytes Absolute: 3.3 x10E3/uL — ABNORMAL HIGH (ref 0.7–3.1)
Lymphs: 28 %
MCH: 32.5 pg (ref 26.6–33.0)
MCHC: 33.5 g/dL (ref 31.5–35.7)
MCV: 97 fL (ref 79–97)
Monocytes Absolute: 1 x10E3/uL — ABNORMAL HIGH (ref 0.1–0.9)
Monocytes: 9 %
Neutrophils Absolute: 6.9 x10E3/uL (ref 1.4–7.0)
Neutrophils: 59 %
Platelets: 399 x10E3/uL (ref 150–450)
RBC: 5.07 x10E6/uL (ref 4.14–5.80)
RDW: 11.7 % (ref 11.6–15.4)
WBC: 11.7 x10E3/uL — ABNORMAL HIGH (ref 3.4–10.8)

## 2024-08-02 NOTE — Progress Notes (Signed)
   08/02/2024  Patient ID: Jamie Rice, male   DOB: [birth year], 68 y.o.   MRN: [id number]  This patient is appearing on a report for being at risk of failing the adherence measure for cholesterol (statin) and diabetes medications this calendar year.   Medication: atorvastatin  80 mg tablets Last fill date: 06/27/24 for 90 day supply  Medication: XIGDUO  XR 10 MG-1,000 MG TAB Last fill date: 07/04/24 for 90 day supply  Insurance report was not up to date. No action needed at this time. A member of the pharmacy team (Dr. Jenkins Au) spoke with him on 10/16 and switched him to [facility].   Dallas Torok C. Cornelio Parkerson [facility] PharmD Candidate Class of [phone]

## 2024-08-28 ENCOUNTER — Telehealth (HOSPITAL_BASED_OUTPATIENT_CLINIC_OR_DEPARTMENT_OTHER): Payer: Self-pay | Admitting: *Deleted

## 2024-08-28 NOTE — Telephone Encounter (Signed)
 I s/w the pt and he stated he will not be able to do tele on 08/31/24 and asked if he could do it Friday 09/01/24.   Tele appt has been moved to 09/01/24

## 2024-08-28 NOTE — Telephone Encounter (Signed)
 Patient called to reschedule tele-visit.

## 2024-08-28 NOTE — Telephone Encounter (Signed)
 Note I s/w the pt and he stated he will not be able to do tele on 08/31/24 and asked if he could do it Friday 09/01/24.    Tele appt has been moved to 09/01/24       08/28/24 10:11 AM You contacted Marx, Doig   08/28/24 10:01 AM Tanda Needle B routed this conversation to Cv Div Preop Callback (Selected Message) Tanda Needle KATHEE MERTIE   08/28/24 10:01 AM Note Patient called to reschedule tele-visit.

## 2024-08-31 ENCOUNTER — Ambulatory Visit

## 2024-09-01 ENCOUNTER — Ambulatory Visit: Attending: Internal Medicine | Admitting: Physician Assistant

## 2024-09-01 DIAGNOSIS — Z01810 Encounter for preprocedural cardiovascular examination: Secondary | ICD-10-CM

## 2024-09-01 NOTE — Progress Notes (Signed)
 Virtual Visit via Telephone Note   Because of Jamie Rice co-morbid illnesses, he is at least at moderate risk for complications without adequate follow up.  This format is felt to be most appropriate for this patient at this time.  Due to technical limitations with video connection (technology), today's appointment will be conducted as an audio only telehealth visit, and Jamie Rice verbally agreed to proceed in this manner.   All issues noted in this document were discussed and addressed.  No physical exam could be performed with this format.  Evaluation Performed:  Preoperative cardiovascular risk assessment _____________   Date:  09/01/2024   Patient ID:  Jamie Rice, DOB 03-Feb-1956, MRN [id number] Patient Location:  Home Provider location:   Office  Primary Care Provider:  Alvia Bring, DO Primary Cardiologist:  Jamie Parchment, Jamie Rice  Chief Complaint / Patient Profile   68 y.o. y/o male with a h/o coronary artery disease status post myocardial infarction back in 2014 with bare-metal stent to the right coronary artery in [place], hyperlipidemia, hypertension, type 2 diabetes mellitus who is pending ACDF C3-C4  and presents today for telephonic preoperative cardiovascular risk assessment.  History of Present Illness    Jamie Rice is a 68 y.o. male who presents via audio/video conferencing for a telehealth visit today.  Pt was last seen in cardiology clinic on 03/16/2024 by Dr. Parchment.  At that time Jamie Rice was doing well.  He was cleared at this time for neck surgery but since it has been almost 6 months we planned to have a conversation today to make sure he is still feeling well from a cardiovascular standpoint.  The patient is now pending procedure as outlined above. Since his last visit, he as far as he knows, he hasn't had any chest pains, no SOB or swelling. He has a smart watch because he lives by himself and [place]. He lives by  himself. He usually walks around 12,000 steps a day. He also works part time. He has a large indoor pool. He does try to stay in shape.   He hasn't decided yet if he wants to go forward with the surgery.   He can hold Plavix  x 7 days prior to procedure and resume when medically safe to do so.   Past Medical History    Past Medical History:  Diagnosis Date   Anxiety    Arthritis    Back pain    CAD (coronary artery disease)    Depression    HTN (hypertension)    Hypercholesteremia    Past Surgical History:  Procedure Laterality Date   HERNIA REPAIR     PTCA with stent - [place]     VASECTOMY      Allergies  No Known Allergies  Home Medications    Prior to Admission medications   Medication Sig Start Date End Date Taking? Authorizing Provider  atorvastatin  (LIPITOR) 80 MG tablet Take 1 tablet (80 mg total) by mouth daily. 03/16/24   Rice Jamie BROCKS, Jamie Rice  cholecalciferol (VITAMIN D3) 25 MCG (1000 UNIT) tablet Take 1,000 Units by mouth daily.    Provider, Historical, Jamie Rice  Cinnamon 500 MG capsule Take 500 mg by mouth 2 (two) times daily.    Provider, Historical, Jamie Rice  clopidogrel  (PLAVIX ) 75 MG tablet TAKE ONE TABLET BY MOUTH ONCE A DAY 07/03/24   Alvia Bring, DO  Cyanocobalamin (B-12 PO) Take 1,000 mcg by mouth.    Provider, Historical,  Jamie Rice  Dapagliflozin Pro-metFORMIN ER (XIGDUO  XR) 07-999 MG TB24 TAKE ONE TABLET BY MOUTH ONCE A DAY 07/04/24   Alvia Bring, DO  enalapril  (VASOTEC ) 10 MG tablet Take 1 tablet (10 mg total) by mouth daily. 03/16/24   Jamie Jamie BROCKS, Jamie Rice  ezetimibe  (ZETIA ) 10 MG tablet Take 1 tablet (10 mg total) by mouth daily. 03/16/24   Jamie Jamie BROCKS, Jamie Rice  GARLIC PO Take 1,000 mg by mouth.    Provider, Historical, Jamie Rice  GINSENG PO Take 2,250 mg by mouth.    Provider, Historical, Jamie Rice  Lutein 20 MG TABS     Provider, Historical, Jamie Rice  Magnesium (RA NATURAL MAGNESIUM) 250 MG TABS     Provider, Historical, Jamie Rice  nitroGLYCERIN  (NITROSTAT ) 0.4 MG SL tablet Place 1 tablet (0.4 mg  total) under the tongue every 5 (five) minutes as needed for chest pain. 03/16/24   Jamie Jamie BROCKS, Jamie Rice  Omega-3 Fatty Acids (FISH OIL PO) Take 1,200 mg by mouth 2 (two) times daily.     Provider, Historical, Jamie Rice  OVER THE COUNTER MEDICATION Take 1,000 mg by mouth. HMB supplement    Provider, Historical, Jamie Rice  pyridoxine (B-6) 250 MG tablet     Provider, Historical, Jamie Rice  vitamin k 100 MCG tablet Take 100 mcg by mouth daily.    Provider, Historical, Jamie Rice    Physical Exam    Vital Signs:  Jamie Rice does not have vital signs available for review today.  Given telephonic nature of communication, physical exam is limited. AAOx3. NAD. Normal affect.  Speech and respirations are unlabored.  Accessory Clinical Findings    None  Assessment & Plan    1.  Preoperative Cardiovascular Risk Assessment:  Jamie Rice perioperative risk of a major cardiac event is 6.6% according to the Revised Cardiac Risk Index (RCRI).  Therefore, he is at high risk for perioperative complications.   His functional capacity is good at 6.55 METs according to the Duke Activity Status Index (DASI). Recommendations: According to ACC/AHA guidelines, no further cardiovascular testing needed.  The patient may proceed to surgery at acceptable risk.   Antiplatelet and/or Anticoagulation Recommendations: Clopidogrel  (Plavix ) can be held for 7 days prior to his surgery and resumed as soon as possible post op.  The patient was advised that if he develops new symptoms prior to surgery to contact our office to arrange for a follow-up visit, and he verbalized understanding.  A copy of this note will be routed to requesting surgeon.  Time:   Today, I have spent 15 minutes with the patient with telehealth technology discussing medical history, symptoms, and management plan.     Jamie LOISE Fabry, PA-C  09/01/2024, 7:24 AM

## 2024-09-27 ENCOUNTER — Other Ambulatory Visit (HOSPITAL_COMMUNITY): Payer: Self-pay

## 2024-09-27 MED FILL — Dapagliflozin Prop-Metformin HCl Tab ER 24HR 10-1000 MG: ORAL | 90 days supply | Qty: 90 | Fill #0 | Status: AC

## 2024-09-27 NOTE — Progress Notes (Signed)
 Pharmacy Quality Measure Review  This patient is appearing on a report for being at risk of failing the adherence measure for cholesterol (statin) medications this calendar year.   Medication: atorvastatin  20 mg Last fill date: 06/27/24 for 90 day supply  Contacted pharmacy to facilitate refills.  Jenkins Graces, PharmD PGY1 Pharmacy Resident

## 2025-01-02 ENCOUNTER — Ambulatory Visit: Admitting: Family Medicine
